# Patient Record
Sex: Male | Born: 1955
Health system: Southern US, Community
[De-identification: ages and names within clinical notes are randomized; demographics above are authoritative.]

## PROBLEM LIST (undated history)

## (undated) DIAGNOSIS — G43909 Migraine, unspecified, not intractable, without status migrainosus: Secondary | ICD-10-CM

## (undated) DIAGNOSIS — G4733 Obstructive sleep apnea (adult) (pediatric): Secondary | ICD-10-CM

## (undated) DIAGNOSIS — R351 Nocturia: Secondary | ICD-10-CM

## (undated) DIAGNOSIS — N529 Male erectile dysfunction, unspecified: Secondary | ICD-10-CM

## (undated) DIAGNOSIS — C61 Malignant neoplasm of prostate: Secondary | ICD-10-CM

## (undated) DIAGNOSIS — T4145XA Adverse effect of unspecified anesthetic, initial encounter: Secondary | ICD-10-CM

## (undated) DIAGNOSIS — T8859XA Other complications of anesthesia, initial encounter: Secondary | ICD-10-CM

## (undated) DIAGNOSIS — Z8719 Personal history of other diseases of the digestive system: Secondary | ICD-10-CM

## (undated) DIAGNOSIS — Z23 Encounter for immunization: Secondary | ICD-10-CM

## (undated) DIAGNOSIS — E785 Hyperlipidemia, unspecified: Secondary | ICD-10-CM

## (undated) HISTORY — DX: Malignant neoplasm of prostate: C61

## (undated) HISTORY — DX: Nocturia: R35.1

## (undated) HISTORY — PX: SP THROMBOECT PRIM MECH ADD - SEP: HXRAD91

## (undated) HISTORY — PX: TOTAL HIP ARTHROPLASTY: SHX124

## (undated) HISTORY — DX: Migraine, unspecified, not intractable, without status migrainosus: G43.909

## (undated) HISTORY — DX: Hyperlipidemia, unspecified: E78.5

## (undated) HISTORY — DX: Obstructive sleep apnea (adult) (pediatric): G47.33

## (undated) HISTORY — PX: APPENDECTOMY: SHX54

## (undated) HISTORY — DX: Encounter for immunization: Z23

## (undated) HISTORY — DX: Personal history of other diseases of the digestive system: Z87.19

## (undated) HISTORY — DX: Male erectile dysfunction, unspecified: N52.9

## (undated) HISTORY — PX: PROSTATE BIOPSY: SHX241

---

## 2000-07-03 ENCOUNTER — Emergency Department (HOSPITAL_COMMUNITY): Admission: EM | Admit: 2000-07-03 | Discharge: 2000-07-03 | Payer: Self-pay | Admitting: Emergency Medicine

## 2000-07-05 ENCOUNTER — Emergency Department (HOSPITAL_COMMUNITY): Admission: EM | Admit: 2000-07-05 | Discharge: 2000-07-05 | Payer: Self-pay | Admitting: Emergency Medicine

## 2000-07-19 ENCOUNTER — Emergency Department (HOSPITAL_COMMUNITY): Admission: EM | Admit: 2000-07-19 | Discharge: 2000-07-19 | Payer: Self-pay | Admitting: Emergency Medicine

## 2003-05-12 ENCOUNTER — Inpatient Hospital Stay (HOSPITAL_COMMUNITY): Admission: EM | Admit: 2003-05-12 | Discharge: 2003-05-13 | Payer: Self-pay | Admitting: Emergency Medicine

## 2003-05-12 ENCOUNTER — Encounter (INDEPENDENT_AMBULATORY_CARE_PROVIDER_SITE_OTHER): Payer: Self-pay

## 2003-07-30 ENCOUNTER — Emergency Department (HOSPITAL_COMMUNITY): Admission: EM | Admit: 2003-07-30 | Discharge: 2003-07-30 | Payer: Self-pay | Admitting: Emergency Medicine

## 2004-03-28 ENCOUNTER — Ambulatory Visit (HOSPITAL_BASED_OUTPATIENT_CLINIC_OR_DEPARTMENT_OTHER): Admission: RE | Admit: 2004-03-28 | Discharge: 2004-03-28 | Payer: Self-pay | Admitting: Internal Medicine

## 2004-04-25 ENCOUNTER — Ambulatory Visit (HOSPITAL_BASED_OUTPATIENT_CLINIC_OR_DEPARTMENT_OTHER): Admission: RE | Admit: 2004-04-25 | Discharge: 2004-04-25 | Payer: Self-pay | Admitting: Internal Medicine

## 2006-10-23 ENCOUNTER — Ambulatory Visit (HOSPITAL_BASED_OUTPATIENT_CLINIC_OR_DEPARTMENT_OTHER): Admission: RE | Admit: 2006-10-23 | Discharge: 2006-10-23 | Payer: Self-pay | Admitting: Otolaryngology

## 2006-10-31 ENCOUNTER — Ambulatory Visit: Payer: Self-pay | Admitting: Internal Medicine

## 2010-10-14 ENCOUNTER — Inpatient Hospital Stay (HOSPITAL_COMMUNITY)
Admission: RE | Admit: 2010-10-14 | Discharge: 2010-10-16 | Payer: Self-pay | Source: Home / Self Care | Attending: Orthopedic Surgery | Admitting: Orthopedic Surgery

## 2011-01-05 LAB — CBC
Hemoglobin: 7.9 g/dL — ABNORMAL LOW (ref 13.0–17.0)
Hemoglobin: 9.3 g/dL — ABNORMAL LOW (ref 13.0–17.0)
MCH: 29.8 pg (ref 26.0–34.0)
MCHC: 32 g/dL (ref 30.0–36.0)
RBC: 2.65 MIL/uL — ABNORMAL LOW (ref 4.22–5.81)
RBC: 3.24 MIL/uL — ABNORMAL LOW (ref 4.22–5.81)

## 2011-01-05 LAB — TYPE AND SCREEN: Unit division: 0

## 2011-01-05 LAB — BASIC METABOLIC PANEL
CO2: 27 mEq/L (ref 19–32)
Calcium: 8 mg/dL — ABNORMAL LOW (ref 8.4–10.5)
Calcium: 8.1 mg/dL — ABNORMAL LOW (ref 8.4–10.5)
Chloride: 109 mEq/L (ref 96–112)
GFR calc Af Amer: >60 mL/min (ref >60)
GFR calc Af Amer: >60 mL/min (ref >60)
GFR calc non Af Amer: 58 mL/min — ABNORMAL LOW (ref >60)
Sodium: 138 mEq/L (ref 135–145)
Sodium: 139 mEq/L (ref 135–145)

## 2011-01-05 LAB — ABO/RH: ABO/RH(D): B POS

## 2011-01-06 LAB — CBC
HCT: 41.9 % (ref 39.0–52.0)
Hemoglobin: 13.7 g/dL (ref 13.0–17.0)
MCH: 29.6 pg (ref 26.0–34.0)
MCV: 90.5 fL (ref 78.0–100.0)
Platelets: 201 10*3/uL (ref 150–400)
RBC: 4.63 MIL/uL (ref 4.22–5.81)

## 2011-01-06 LAB — DIFFERENTIAL
Eosinophils Absolute: 0.2 10*3/uL (ref 0.0–0.7)
Eosinophils Relative: 4 % (ref 0–5)
Lymphs Abs: 1.8 10*3/uL (ref 0.7–4.0)
Monocytes Absolute: 0.4 10*3/uL (ref 0.1–1.0)
Monocytes Relative: 9 % (ref 3–12)

## 2011-01-06 LAB — BASIC METABOLIC PANEL
CO2: 27 mEq/L (ref 19–32)
Chloride: 103 mEq/L (ref 96–112)
Creatinine, Ser: 1.44 mg/dL (ref 0.4–1.5)
GFR calc Af Amer: >60 mL/min (ref >60)
Potassium: 4.6 mEq/L (ref 3.5–5.1)

## 2011-01-06 LAB — URINALYSIS, ROUTINE W REFLEX MICROSCOPIC
Nitrite: NEGATIVE
Specific Gravity, Urine: 1.008 (ref 1.005–1.030)
Urobilinogen, UA: 0.2 mg/dL (ref 0.0–1.0)
pH: 6 (ref 5.0–8.0)

## 2011-01-06 LAB — SURGICAL PCR SCREEN: MRSA, PCR: NEGATIVE

## 2011-03-13 NOTE — H&P (Signed)
NAME:  Vincent Daugherty, Vincent Daugherty                        ACCOUNT NO.:  [id number]   MEDICAL RECORD NO.:  [id number]                   PATIENT TYPE:  EMS   LOCATION:  MAJO                                 FACILITY:  [facility]   PHYSICIAN:  Jimmye Norman III, M.D.               DATE OF BIRTH:  [birth year]   DATE OF ADMISSION:  05/12/2003  DATE OF DISCHARGE:                                HISTORY & PHYSICAL   CHIEF COMPLAINT:  The patient is a 55 year old gentleman with abdominal  pain, localized to the right lower quadrant and likely acute appendicitis.   HISTORY OF PRESENT ILLNESS:  The patient became ill yesterday with abdominal  pain, cramping in the lower abdomen, generalized and non-localized to the  right lower quadrant. The ED physician called me upon examining the patient  and getting his laboratory studies, which indicate likely acute appendicitis  by clinical evaluation.   PAST MEDICAL HISTORY:  Remarkable only for migraine headaches, for which he  takes Imitrex intermittently.   PAST SURGICAL HISTORY:  None.   FAMILY HISTORY:  Mother with hypertension. Father is healthy otherwise. He  has a brother who is diabetic.   REVIEW OF SYSTEMS:  Nothing eaten since yesterday. He has not been able to  hold down any foods. He has had bowel movement 2 days ago. Otherwise, he is  fine.  He has no appetite.   SOCIAL HISTORY:  He is a non-smoker and has occasional beer and wine. He  works as a Production designer, theatre/television/film at [employer].   PHYSICAL EXAMINATION:  VITAL SIGNS: Afebrile at 98. Blood pressure 125/79,  pulse of 60, respirations 20. O2 saturations are 100%.  HEENT: He is normocephalic and atraumatic and anicteric. Mucous membranes  are moist and pink.  GENERAL: He looks healthy but just feels uncomfortable and looks nauseated.  NECK: Supple. No palpable masses. No palpable thyroid masses.  CHEST: Clear to auscultation.  CARDIAC: Regular rhythm and rate with no murmurs, gallops, lifts, or heaves.  ABDOMEN: Quiet. He has tenderness in the right lower quadrant with rebound  and guarding and he has a positive Rovsing sign.  RECTAL: Examination performed by the primary care physician in the office  demonstrated heme-positive stools, no palpable masses.   LABORATORY DATA:  Done at the outside facility show a white count of 16,000.  Hemoglobin of 14. Hematocrit of 42%. The UA is pending.   No CT scan was done.   IMPRESSION:  Very likely acute appendicitis based on clinical examination  and a white count of 16,000 with localized tenderness in the right lower  quadrant associated with decreased appetite, nausea, and vomiting.    PLAN:  Take the patient to the operating room for a laparoscopic  appendectomy to be done as soon as possible. The other possibilities are a  Meckel's diverticulum or acute gastroenteritis, however, most likely is  acute appendicitis. The risks and benefits of the procedure  have been  explained to the patient and his wife, and we will proceed with the  procedure as soon as possible.                                               Kathrin Ruddy, M.D.    JW/MEDQ  D:  05/12/2003  T:  05/13/2003  Job:  [id number]

## 2011-03-13 NOTE — Op Note (Signed)
NAME:  Vincent Daugherty, Vincent Daugherty                        ACCOUNT NO.:  [id number]   MEDICAL RECORD NO.:  [id number]                   PATIENT TYPE:  INP   LOCATION:  1828                                 FACILITY:  [facility]   PHYSICIAN:  Jimmye Norman III, M.D.               DATE OF BIRTH:  1955/11/14   DATE OF PROCEDURE:  05/12/2003  DATE OF DISCHARGE:                                 OPERATIVE REPORT   PREOPERATIVE DIAGNOSIS:  Acute appendicitis.   POSTOPERATIVE DIAGNOSIS:  Acute suppurative appendicitis.   PROCEDURE:  Laparoscopic appendectomy.   SURGEON:  Jimmye Norman, M.D.   ANESTHESIA:  General endotracheal.   ESTIMATED BLOOD LOSS:  Less than 30 mL.   COMPLICATIONS:  None.   CONDITION:  Stable.   FINDINGS:  Acutely inflamed but no overt perforation but a small amount of  purulent fluid in the right paracolic area.  It was densely adherent  laterally but not retrocecal and also adherent to omentum medially.   OPERATION:  The patient was taken to the operating room and placed on the  table in a supine position.  After an adequate endotracheal anesthetic was  administered, he was prepped and draped in the usual sterile manner exposing  the midline and the full abdomen.   The patient was placed in Trendelenburg position.  A supraumbilical  curvilinear incision was made using a #11 blade and taken down to the  midline fascia.  It was through this fascia that a Veress needle was passed  into the peritoneal cavity and confirmed to be in position using the saline  test.  Once this was done carbon dioxide insufflation was instilled into the  peritoneal cavity up to a maximal intra-abdominal pressure of 15 mmHg.  Once  this was done, an 11/12 mm trocar and cannula were passed through the  supraumbilical fascia into the peritoneal cavity and confirmed to be in  position with the laparoscope and attached camera and light source.   With the patient in Trendelenburg and the left side tilted  down, we placed a  right subcostal 5 mm cannula under direct vision into the peritoneal cavity  and using a grasper were able to mobilize the cecum and sort of disclose the  acutely inflamed suppurative appendicitis.   A suprapubic 11/12 mm cannula with a blunt tube was passed into the  peritoneal cavity under direct vision also.  With the appropriate adapter, a  second dissector was placed through the suprapubic cannula and we  subsequently dissected out the appendix.  It was densely adherent laterally  and medially, and the midportion of the appendix was what appeared to be  acutely inflamed.  We were able to find the attachment of the appendix to  the base of the cecum and were able to detach it there using an Endo-GIA  with 3.5 mm staples.  We were then able to dissect away most of the  adhesions to the mesoappendix and then subsequently come across the  mesoappendix using a 2.5 mm stapling Endo-GIA.  Once this was done we were  able to bring the appendix out through the suprapubic site using EndoCatch  bag.   We irrigated with about 2 L of warm saline solution.  There was minimal to  no bleeding at the conclusion of the case.  The appendix was examined for  continuity afterwards, and it was found to be fine.   The supraumbilical fascia was reapproximated using a figure-of-eight stitch  of 0 Vicryl passed on a UR-6 needle.  We injected 0.25% with epinephrine at  all sites.  We then closed the skin using a running subcuticular stitch of 5-  0 Vicryl.  All needle counts, sponge counts, and instrument counts were  correct.  Sterile dressings were applied.                                               Kathrin Ruddy, M.D.    JW/MEDQ  D:  05/12/2003  T:  05/14/2003  Job:  [id number]

## 2011-03-13 NOTE — Procedures (Signed)
NAMEOZZY, BOHLKEN NO.:  [id number]   MEDICAL RECORD NO.:  [id number]          PATIENT TYPE:  OUT   LOCATION:  SLEEP CENTER                 FACILITY:  [facility]   PHYSICIAN:  Clinton D. Maple Hudson, MD, FCCP, FACPDATE OF BIRTH:  05-22-1956   DATE OF STUDY:  10/23/2006                            NOCTURNAL POLYSOMNOGRAM   REFERRING PHYSICIAN:  Onalee Hua L. Annalee Genta, M.D.   INDICATION FOR STUDY:  Hypersomnia with sleep apnea.   EPWORTH SLEEPINESS SCORE:  6/24.  BMI 25.1.  Weight 196 pounds.   MEDICATIONS:  Were listed and reviewed.   SLEEP ARCHITECTURE:  Total sleep time 331 minutes with sleep efficiency  78%.  Stage I was 11%, stage II 71%, stages III and IV absent, REM 18%  of total sleep time.  Sleep latency 32 minutes.  REM latency 92 minutes.  Awake after sleep onset 61 minutes.  Arousal index 9.8.  No bedtime  medication was reported.   RESPIRATORY DATA:  Apnea/hypopnea index (AHI, RDI) 4.9 obstructive  events per hour.  This included 1 central apnea, 24 obstructive apneas,  1 mixed apnea, and 1 hypopnea.  Events were substantially more common  while supine (AHI 6.1 per hour).  REM AHI 9.2.  There were insufficient  events to quality for CPAP titration by split protocol on this study  night.   OXYGEN DATA:  Moderately loud snoring with oxygen desaturation to a  nadir of 92%.  Mean oxygen saturation through the study was 97% on room  air.   CARDIAC DATA:  Normal sinus rhythm.   MOVEMENT-PARASOMNIA:  Rare leg jerk with little effect on sleep.   IMPRESSIONS-RECOMMENDATIONS:  1. Occasional sleep disorder breathing events, AHI 4.9 per hour      (normal range 0-5 per hour).  Events were more common while supine      and in REM.  Moderately loud snoring with oxygen desaturation with      nadir of 92%.  2. There were insufficient events to qualify for CPAP titration by      split protocol on this study night.  A baseline      diagnostic study on March 28, 2004, had  recorded an AHI of 30 per      hour.  CPAP titration on April 25, 2004, had set CPAP pressure at 7      CWP, AHI 0.4 per hour.      Clinton D. Maple Hudson, MD, [facility], FACP  Diplomate, Biomedical engineer of Sleep Medicine  Electronically Signed     CDY/MEDQ  D:  10/31/2006 12:01:35  T:  10/31/2006 21:14:04  Job:  [id number]

## 2013-03-23 ENCOUNTER — Ambulatory Visit (INDEPENDENT_AMBULATORY_CARE_PROVIDER_SITE_OTHER): Payer: BC Managed Care – PPO | Admitting: Licensed Clinical Social Worker

## 2013-03-23 DIAGNOSIS — F4320 Adjustment disorder, unspecified: Secondary | ICD-10-CM

## 2013-03-27 ENCOUNTER — Ambulatory Visit (INDEPENDENT_AMBULATORY_CARE_PROVIDER_SITE_OTHER): Payer: BC Managed Care – PPO | Admitting: Licensed Clinical Social Worker

## 2013-03-27 DIAGNOSIS — F4320 Adjustment disorder, unspecified: Secondary | ICD-10-CM

## 2013-04-03 ENCOUNTER — Ambulatory Visit (INDEPENDENT_AMBULATORY_CARE_PROVIDER_SITE_OTHER): Payer: BC Managed Care – PPO | Admitting: Licensed Clinical Social Worker

## 2013-04-03 DIAGNOSIS — F4320 Adjustment disorder, unspecified: Secondary | ICD-10-CM

## 2013-04-17 ENCOUNTER — Ambulatory Visit (INDEPENDENT_AMBULATORY_CARE_PROVIDER_SITE_OTHER): Payer: BC Managed Care – PPO | Admitting: Licensed Clinical Social Worker

## 2013-04-17 DIAGNOSIS — F4320 Adjustment disorder, unspecified: Secondary | ICD-10-CM

## 2013-05-03 ENCOUNTER — Ambulatory Visit: Payer: BC Managed Care – PPO | Admitting: Licensed Clinical Social Worker

## 2013-08-31 ENCOUNTER — Encounter (INDEPENDENT_AMBULATORY_CARE_PROVIDER_SITE_OTHER): Payer: Self-pay

## 2013-08-31 ENCOUNTER — Ambulatory Visit (INDEPENDENT_AMBULATORY_CARE_PROVIDER_SITE_OTHER): Payer: BC Managed Care – PPO | Admitting: Neurology

## 2013-08-31 ENCOUNTER — Encounter: Payer: Self-pay | Admitting: Neurology

## 2013-08-31 VITALS — BP 123/78 | HR 78 | Temp 97.4°F | Ht 74.5 in | Wt 234.0 lb

## 2013-08-31 DIAGNOSIS — G4733 Obstructive sleep apnea (adult) (pediatric): Secondary | ICD-10-CM

## 2013-08-31 DIAGNOSIS — R351 Nocturia: Secondary | ICD-10-CM

## 2013-08-31 DIAGNOSIS — J309 Allergic rhinitis, unspecified: Secondary | ICD-10-CM

## 2013-08-31 DIAGNOSIS — G43109 Migraine with aura, not intractable, without status migrainosus: Secondary | ICD-10-CM

## 2013-08-31 HISTORY — DX: Obstructive sleep apnea (adult) (pediatric): G47.33

## 2013-08-31 NOTE — Progress Notes (Signed)
Subjective:    Patient ID: Vincent Daugherty is a 57 y.o. male.  HPI  Huston Foley, MD, PhD [facility] [address] [address] [place], [place] [address]  Dear Dr. Clelia Croft,   I saw your patient, Vincent Daugherty, upon your kind request in my neurologic clinic today for initial consultation of his sleep disorder, in particular his obstructive sleep apnea. The patient is accompanied by his wife, Vincent Daugherty, today. As you know, Vincent Daugherty is a very pleasant 57 year old right-handed gentleman with an underlying medical history of allergic rhinitis, diverticulosis, arthritis, status post left total hip replacement surgery in December 2011, hyperlipidemia, migraines, erectile dysfunction, and prior diagnosis of obstructive sleep apnea who has previously been treated with CPAP but has not used his CPAP machine in over 2 years and needs a new machine. I reviewed his sleep study report from 10/23/2006 which you kindly included. His total AHI was 4.9 per hour rising to 6.1 per hour. He had an increased percentage of stage I and stage II sleep, a normal REM latency and mildly decrease of REM percentage, and absence of deep sleep. According to the sleep study report he has previously been diagnosed with severe obstructive sleep apnea based on an AHI of 30 per hour in June 2005 as well as a CPAP titration study in July 2005 during which time his pressure was 7 cm of water pressure. He used it for about 1-2 months, but did not like the noise of the machine and the mask was uncomfortable. He brought in his old machine, but I was not able to see what pressure it was on. He had a [employer]. He had a [employer] nasal pillows, size M.   His typical bedtime is reported to be around 9 to 9:30 PM and usual wake time is around 5 to 5:30 AM. Sleep onset typically occurs within minutes. He reports feeling poorly rested upon awakening. He wakes up on an average 4 times in the middle of the  night and has to go to the bathroom 3 or 4 times on a typical night. He has no Hx of prostrate problems and has been checked for DM. He admits to occasional morning headaches.  He reports excessive daytime somnolence (EDS) and His Epworth Sleepiness Score (ESS) is 9/24 today. He has not fallen asleep while driving. The patient has not been taking a planned nap, but has taken inadvertent naps. He reports dreaming in a nap occasionally. Naps can be refreshing.  He has been known to snore for the past many years. Snoring is reportedly severe, and associated with choking sounds and witnessed apneas. The patient admits to a sense of choking or strangling feeling. There is no report of nighttime reflux, with occasional nighttime cough experienced. The patient has not noted any RLS symptoms but  is known to kick while asleep or before falling asleep. There is family history of RLS or OSA. His mother has narcolepsy and some memory loss. He is a restless sleeper and in the morning, the bed is quite disheveled.   He denies cataplexy, sleep paralysis, hypnagogic or hypnopompic hallucinations, or sleep attacks. He does report vivid dreams, some nightmares, and his wife reports occasional dream enactments, some sleep talking, but no sleep walking. He consumes 1.5 caffeinated beverages per day, usually in the form of coffee in the morning.   His bedroom is usually dark and cool. There is a TV in the bedroom and usually it is  on at night.   His Past Medical History Is Significant For: Past Medical History  Diagnosis Date  . Other and unspecified hyperlipidemia   . Migraine, unspecified, without mention of intractable migraine without mention of status migrainosus   . Obstructive sleep apnea (adult) (pediatric)   . Nocturia   . Impotence of organic origin   . Personal history of other diseases of digestive system   . Need for prophylactic vaccination and inoculation against influenza     His Past Surgical History  Is Significant For: Past Surgical History  Procedure Laterality Date  . Sp thromboect prim mech add - sep      anterior approach    His Family History Is Significant For: Family History  Problem Relation Age of Onset  . Ulcers Father   . Hypertension Mother   . CAD Mother   . Stroke Mother     His Social History Is Significant For: History   Social History  . Marital Status: Married    Spouse Name: N/A    Number of Children: N/A  . Years of Education: N/A   Social History Main Topics  . Smoking status: Former Smoker    Types: Cigarettes  . Smokeless tobacco: None  . Alcohol Use: 0.0 oz/week    0.5 drink(s) per week  . Drug Use: No  . Sexual Activity: None   Other Topics Concern  . None   Social History Narrative  . None    His Allergies Are:  Allergies  Allergen Reactions  . Ampicillin Diarrhea  :   His Current Medications Are:  Outpatient Encounter Prescriptions as of 08/31/2013  Medication Sig  . simvastatin (ZOCOR) 40 MG tablet Take 1 tablet by mouth daily.  . SUMAtriptan (IMITREX) 100 MG tablet Take 1 tablet by mouth as needed.  . sildenafil (VIAGRA) 100 MG tablet Take 100 mg by mouth daily as needed for erectile dysfunction.  :  Review of Systems:  Out of a complete 14 point review of systems, all are reviewed and negative with the exception of these symptoms as listed below:  Review of Systems  Constitutional: Positive for fatigue and unexpected weight change (gain).  HENT: Positive for rhinorrhea and trouble swallowing.   Eyes: Negative.   Respiratory: Positive for shortness of breath.        Snoring  Cardiovascular: Negative.   Gastrointestinal: Negative.   Endocrine: Positive for polydipsia.  Genitourinary: Positive for difficulty urinating.  Musculoskeletal: Negative.   Skin: Positive for rash.  Allergic/Immunologic: Positive for environmental allergies.  Neurological: Positive for headaches.  Hematological: Negative.    Psychiatric/Behavioral: Positive for sleep disturbance.    Objective:  Neurologic Exam  Physical Exam Physical Examination:   Filed Vitals:   08/31/13 0824  BP: 123/78  Pulse: 78  Temp: 97.4 F (36.3 C)    General Examination: The patient is a very pleasant 57 y.o. male in no acute distress. He appears well-developed and well-nourished and very well groomed.   HEENT: Normocephalic, atraumatic, pupils are equal, round and reactive to light and accommodation. Funduscopic exam is normal with sharp disc margins noted. Extraocular tracking is good without limitation to gaze excursion or nystagmus noted. Normal smooth pursuit is noted. Hearing is grossly intact. Tympanic membranes are clear bilaterally. Face is symmetric with normal facial animation and normal facial sensation. Speech is clear with no dysarthria noted. There is no hypophonia. There is no lip, neck/head, jaw or voice tremor. Neck is supple with full range of passive and  active motion. There are no carotid bruits on auscultation. Oropharynx exam reveals: mild mouth dryness, dentures in place. There is a moderately airway crowding, due to large tongue and floppy soft palate; uvula and tonsils are actually small. Mallampati is class II. Tongue protrudes centrally and palate elevates symmetrically. Tonsils are 1+. Neck size is 16.25 inches.   Chest: Clear to auscultation without wheezing, rhonchi or crackles noted.  Heart: S1+S2+0, regular and normal without murmurs, rubs or gallops noted.   Abdomen: Soft, non-tender and non-distended with normal bowel sounds appreciated on auscultation.  Extremities: There is no pitting edema in the distal lower extremities bilaterally. Pedal pulses are intact.  Skin: Warm and dry without trophic changes noted. There are no varicose veins.  Musculoskeletal: exam reveals no obvious joint deformities, tenderness or joint swelling or erythema.   Neurologically:  Mental status: The patient is  awake, alert and oriented in all 4 spheres. His memory, attention, language and knowledge are appropriate. There is no aphasia, agnosia, apraxia or anomia. Speech is clear with normal prosody and enunciation. Thought process is linear. Mood is congruent and affect is normal.  Cranial nerves are as described above under HEENT exam. In addition, shoulder shrug is normal with equal shoulder height noted. Motor exam: Normal bulk, strength and tone is noted. There is no drift, tremor or rebound. Romberg is negative. Reflexes are 2+ throughout. Fine motor skills are intact with normal finger taps, normal hand movements, normal rapid alternating patting, normal foot taps and normal foot agility.  Cerebellar testing shows no dysmetria or intention tremor on finger to nose testing. Heel to shin is unremarkable bilaterally. There is no truncal or gait ataxia.  Sensory exam is intact to light touch, pinprick, vibration, temperature sense and proprioception in the upper and lower extremities.  Gait, station and balance are unremarkable. No veering to one side is noted. No leaning to one side is noted. Posture is age-appropriate and stance is narrow based. No problems turning are noted. He turns en bloc.               Assessment and Plan:   In summary, Vincent Daugherty is a very pleasant 57 y.o.-year old male with a history and physical exam concerning for obstructive sleep apnea (OSA). I had a long chat with the patient and his wife about my findings and the diagnosis, its prognosis and treatment options. We talked about medical treatments and non-pharmacological approaches. I explained in particular the risks and ramifications of untreated moderate to severe OSA, especially with respect to developing cardiovascular disease down the Road, including congestive heart failure, difficult to treat hypertension, cardiac arrhythmias, or stroke. Even type 2 diabetes has in part been linked to untreated OSA. We talked about  trying to maintain a healthy lifestyle in general, as well as the importance of weight control. I encouraged the patient to eat healthy, exercise daily and keep well hydrated, to keep a scheduled bedtime and wake time routine, to not skip any meals and eat healthy snacks in between meals. I also talked to them about improving sleep hygiene. Having the TV on at night his one of the bigger point of contention between them. This may actually improve as she claims that she has to leave the TV on because his snoring is Daugherty loud. I recommended the following at this time: sleep study with potential positive airway pressure titration.  I explained the sleep test procedure to the patient and also outlined possible surgical and non-surgical treatment  options of OSA, including the use of a custom-made dental device, upper airway surgical options, such as pillar implants, radiofrequency surgery, tongue base surgery, and UPPP. I also explained the CPAP treatment option to the patient, who indicated that he would be willing to try CPAP if the need arises. I explained the importance of being compliant with PAP treatment, not only for insurance purposes but primarily to improve His symptoms, and for the patient's long term health benefit, including to reduce His cardiovascular risks. I answered all their questions today and the patient and his wife were in agreement. I would like to see him back after the sleep study is completed and encouraged him to call with any interim questions, concerns, problems or updates.   Thank you very much for allowing me to participate in the care of this nice patient. If I can be of any further assistance to you please do not hesitate to call me at [phone].  Sincerely,   Huston Foley, MD, PhD

## 2013-08-31 NOTE — Patient Instructions (Addendum)

## 2013-09-09 ENCOUNTER — Ambulatory Visit (INDEPENDENT_AMBULATORY_CARE_PROVIDER_SITE_OTHER): Payer: BC Managed Care – PPO | Admitting: Neurology

## 2013-09-09 DIAGNOSIS — IMO0002 Reserved for concepts with insufficient information to code with codable children: Secondary | ICD-10-CM

## 2013-09-09 DIAGNOSIS — J309 Allergic rhinitis, unspecified: Secondary | ICD-10-CM

## 2013-09-09 DIAGNOSIS — G4733 Obstructive sleep apnea (adult) (pediatric): Secondary | ICD-10-CM

## 2013-09-09 DIAGNOSIS — R351 Nocturia: Secondary | ICD-10-CM

## 2013-09-09 DIAGNOSIS — G479 Sleep disorder, unspecified: Secondary | ICD-10-CM

## 2013-09-09 DIAGNOSIS — G43109 Migraine with aura, not intractable, without status migrainosus: Secondary | ICD-10-CM

## 2013-09-13 NOTE — Sleep Study (Signed)
See media tab for full report  

## 2013-09-27 ENCOUNTER — Telehealth: Payer: Self-pay | Admitting: Neurology

## 2013-09-27 ENCOUNTER — Encounter: Payer: Self-pay | Admitting: *Deleted

## 2013-09-27 DIAGNOSIS — G4733 Obstructive sleep apnea (adult) (pediatric): Secondary | ICD-10-CM

## 2013-09-27 NOTE — Telephone Encounter (Signed)
I called and spoke with the patient about his recent sleep study result which confirmed mild sleep apnea especially when sleeping on his back and most pronounced when he was in dream sleep.  I informed the patient that Dr. Frances Furbish recommends he try CPAP therapy at home for 30 days. Patient was in agreement with the plan, so I will send his order to [facility] and they will call him after getting his benefits for the CPAP machine. I informed the patient that I will mail him a copy of the report along with a letter with follow up instruction and I will fax Dr. Alver Fisher office a copy of the report.

## 2013-09-27 NOTE — Telephone Encounter (Signed)
Please advise patient that his recent sleep study confirmed overall mild sleep apnea. It is worse when he sleeps on his back and most pronounced when he is in dream sleep. While this does not warrant treatment with CPAP, I would like for him to consider using CPAP because of his sleep-related issues. One issue was that his sleep was very fragmented and disturbed. He does not go into any consolidated sleep. He also reported to me that he has to get up and use the bathroom frequently at night. He also reported that he occasionally wakes up with the headache and that he typically does not wake up rested. All of this may potentially improve with CPAP.if he agrees we can try him on CPAP at home with a so-called auto PAP machine. This means that I would prescribe a machine that provides variable pressure depending on his degree of obstruction. This could give [place] a good idea about how he does with CPAP. We can try this for about 30 days and I can monitor the computerized report from the machine and see him back in followup in about 5-6 weeks. I then placed a order for the auto PAP. Please let me know, what he wants to do.

## 2014-01-15 ENCOUNTER — Encounter: Payer: Self-pay | Admitting: Neurology

## 2014-01-19 NOTE — Progress Notes (Signed)
Quick Note:  I reviewed the patient's auto CPAP compliance data from 12/06/2013 to 01/04/2014, which is a total of 30 days, during which time the patient used CPAP every day except for 1 day. The average usage for all days was 6 hours and 2 minutes. The percent used days greater than 4 hours was 87 %, indicating very good compliance. The residual AHI was 2.3 per hour, indicating an appropriate treatment pressure range of 6-16 with EPR of 2. 95th percentile of pressure was 9.1 cm. I will review this data with the patient at the next office visit, provide feedback and additional troubleshooting if need be. He is currently scheduled for 01/22/2014 at 8:30 AM. I will most likely suggest that we change him to a set pressure of 9 cm at the time.   Star Age, MD, PhD [facility] ([facility]   ______

## 2014-01-22 ENCOUNTER — Encounter: Payer: Self-pay | Admitting: Neurology

## 2014-01-22 ENCOUNTER — Ambulatory Visit (INDEPENDENT_AMBULATORY_CARE_PROVIDER_SITE_OTHER): Payer: BC Managed Care – PPO | Admitting: Neurology

## 2014-01-22 ENCOUNTER — Encounter (INDEPENDENT_AMBULATORY_CARE_PROVIDER_SITE_OTHER): Payer: Self-pay

## 2014-01-22 VITALS — BP 134/81 | HR 57 | Ht 74.5 in | Wt 234.0 lb

## 2014-01-22 DIAGNOSIS — G4733 Obstructive sleep apnea (adult) (pediatric): Secondary | ICD-10-CM

## 2014-01-22 DIAGNOSIS — Z9989 Dependence on other enabling machines and devices: Principal | ICD-10-CM

## 2014-01-22 DIAGNOSIS — G43109 Migraine with aura, not intractable, without status migrainosus: Secondary | ICD-10-CM

## 2014-01-22 DIAGNOSIS — J309 Allergic rhinitis, unspecified: Secondary | ICD-10-CM

## 2014-01-22 NOTE — Progress Notes (Signed)
Subjective:    Patient ID: Vincent Daugherty is a 58 y.o. male.  HPI    Interim history:   Vincent Daugherty is a very pleasant 58 year old right-handed gentleman with an underlying medical history of allergic rhinitis, diverticulosis, arthritis, status post left total hip replacement surgery in December 2011, hyperlipidemia, migraines, erectile dysfunction, and prior diagnosis of obstructive sleep apnea, who presents for followup consultation of his obstructive sleep apnea, after his recent sleep study. He is unaccompanied today. I first met him on 08/31/2013, at which time he reported occasional morning headaches, and nighttime sleep disruption. I asked him to return for sleep study. He had a baseline sleep study on 09/09/2013 and I went over his test results with him in detail today. His sleep efficiency was reduced at 81%. Latency to sleep was 4 minutes and wake after sleep onset was 78 minutes with severe sleep fragmentation noted. He had an elevated arousal index of 40.7 per hour. His sleep architecture showed an increased percentage of stage I and stage II sleep, a normal percentage of deep sleep and a decreased percentage of REM sleep at 11.4% with a mildly prolonged REM latency. He had no significant periodic leg movements of sleep and no significant EKG changes. Mild to moderate snoring was noted. He had a total AHI of 11.7 per hour, rising to 37.5 per hour in rem sleep and 14.6 per hour in the supine position. Baseline oxygen saturation was 95%, nadir was 86%. Based on the test results I suggested he try AutoPap nonprescribed and AutoPap machine for him. I reviewed compliance data from 12/06/2013 through 01/04/2014 which is a total of 30 days during which time he uses CPAP every night except for one night. Percent used days greater than 4 hours was 87%, indicating very good compliance. Average usage was 6 hours and 2 minutes. Residual AHI was 2.3 per hour. Leak was low. 95th percentile pressure was 9.1  cm. Maximum pressure was 10.3 cm, median pressure was 7.5 cm, range from 6-16 cm with EPR of 2.  Today, reviewed his compliance data from 12/22/2013 to 01/20/2014 which is the last 30 days, during which time he is CPAP every night. Percent used days greater than 4 hours was 93%, indicating excellent compliance. Average usage was 5 hours and 53 minutes, AHI was 2.4 and leak was low at 7.9 L per minute at the 95th percentile. He is on AutoPap and 95th percentile pressure was 8.8 cm. Today, he reports, feeling a little better overall; his wife reports no more snoring, his nocturia is less and he has had less AM HAs. He still has migraines quite a bit. He feels less tired during the day, and is not waking up as much in the middle of the night. He has a much better experience overall as compared to his last CPAP trial.   He had previously been treated with CPAP, but stopped using it. I reviewed his sleep study report from 10/23/2006 last time: His total AHI was 4.9 per hour rising to 6.1 per hour. He had an increased percentage of stage I and stage II sleep, a normal REM latency and mildly decrease of REM percentage, and absence of deep sleep. According to the sleep study report he had been diagnosed with severe obstructive sleep apnea before, based on an AHI of 30 per hour in June 2005 as well as a CPAP titration study in July 2005 during which time his pressure was 7 cm of water pressure. He used it  for about 1-2 months, but did not like the noise of the machine and the mask was uncomfortable.    His Past Medical History Is Significant For: Past Medical History  Diagnosis Date  . Other and unspecified hyperlipidemia   . Migraine, unspecified, without mention of intractable migraine without mention of status migrainosus   . Obstructive sleep apnea (adult) (pediatric)   . Nocturia   . Impotence of organic origin   . Personal history of other diseases of digestive system   . Need for prophylactic  vaccination and inoculation against influenza   . OSA (obstructive sleep apnea) 08/31/2013    His Past Surgical History Is Significant For: Past Surgical History  Procedure Laterality Date  . Sp thromboect prim mech add - sep      anterior approach    His Family History Is Significant For: Family History  Problem Relation Age of Onset  . Ulcers Father   . Hypertension Mother   . CAD Mother   . Stroke Mother     His Social History Is Significant For: History   Social History  . Marital Status: Married    Spouse Name: phillis    Number of Children: 3  . Years of Education: college   Occupational History  . DIRECTOR    Social History Main Topics  . Smoking status: Former Smoker    Types: Cigarettes  . Smokeless tobacco: None  . Alcohol Use: 0.0 oz/week    0.5 drink(s) per week  . Drug Use: No  . Sexual Activity: None   Other Topics Concern  . None   Social History Narrative  . None    His Allergies Are:  Allergies  Allergen Reactions  . Ampicillin Diarrhea  :   His Current Medications Are:  Outpatient Encounter Prescriptions as of 01/22/2014  Medication Sig  . sildenafil (VIAGRA) 100 MG tablet Take 100 mg by mouth daily as needed for erectile dysfunction.  . simvastatin (ZOCOR) 40 MG tablet Take 1 tablet by mouth daily.  . SUMAtriptan (IMITREX) 100 MG tablet Take 1 tablet by mouth as needed.  :  Review of Systems:  Out of a complete 14 point review of systems, all are reviewed and negative with the exception of these symptoms as listed below:  Review of Systems  Respiratory:       Snoring, Acting out Dreams  Neurological: Positive for headaches.  Psychiatric/Behavioral: Positive for sleep disturbance.    Objective:  Neurologic Exam  Physical Exam Physical Examination:   Filed Vitals:   01/22/14 0844  BP: 134/81  Pulse: 57   General Examination: The patient is a very pleasant 58 y.o. male in no acute distress. He appears well-developed and  well-nourished and very well groomed.   HEENT: Normocephalic, atraumatic, pupils are equal, round and reactive to light and accommodation. Funduscopic exam is normal with sharp disc margins noted. He may have the beginning of mild cataracts bilaterally Extraocular tracking is good without limitation to gaze excursion or nystagmus noted. Normal smooth pursuit is noted. Hearing is grossly intact. Tympanic membranes are clear bilaterally. Face is symmetric with normal facial animation and normal facial sensation. Speech is clear with no dysarthria noted. There is no hypophonia. There is no lip, neck/head, jaw or voice tremor. Neck is supple with full range of passive and active motion. There are no carotid bruits on auscultation. Oropharynx exam reveals: mild mouth dryness, dentures in place. There is a moderately airway crowding, due to large tongue and floppy soft  palate; uvula and tonsils are actually small. Mallampati is class II. Tongue protrudes centrally and palate elevates symmetrically. Tonsils are 1+. Nasal inspection reveals no significant nasal mucosal bogginess or redness and no septal deviation.   Chest: Clear to auscultation without wheezing, rhonchi or crackles noted.  Heart: S1+S2+0, regular and normal without murmurs, rubs or gallops noted.   Abdomen: Soft, non-tender and non-distended with normal bowel sounds appreciated on auscultation.  Extremities: There is no pitting edema in the distal lower extremities bilaterally. Pedal pulses are intact.  Skin: Warm and dry without trophic changes noted. There are no varicose veins.  Musculoskeletal: exam reveals no obvious joint deformities, tenderness or joint swelling or erythema.   Neurologically:  Mental status: The patient is awake, alert and oriented in all 4 spheres. His memory, attention, language and knowledge are appropriate. There is no aphasia, agnosia, apraxia or anomia. Speech is clear with normal prosody and enunciation.  Thought process is linear. Mood is congruent and affect is normal.  Cranial nerves are as described above under HEENT exam. In addition, shoulder shrug is normal with equal shoulder height noted. Motor exam: Normal bulk, strength and tone is noted. There is no drift, tremor or rebound. Romberg is negative. Reflexes are 2+ throughout. Fine motor skills are intact with normal finger taps, normal hand movements, normal rapid alternating patting, normal foot taps and normal foot agility.  Cerebellar testing shows no dysmetria or intention tremor on finger to nose testing. There is no truncal or gait ataxia.  Sensory exam is intact to light touch, pinprick, vibration, and temperature sense in the upper and lower extremities.  Gait, station and balance are unremarkable. No veering to one side is noted. No leaning to one side is noted. Posture is age-appropriate and stance is narrow based. No problems turning are noted. He turns en bloc.                       Assessment and Plan:   In summary, MEKAI WILKINSON is a very pleasant 58 y.o.-year old male with an underlying medical history of allergic rhinitis, diverticulosis, arthritis, status post left total hip replacement surgery in December 2011, hyperlipidemia, migraines, erectile dysfunction, and prior diagnosis of obstructive sleep apnea, recently had a baseline sleep study and is now established on AutoPap. He has done really well with adapting to treatment this time around. His exam is stable. He is pleased with how he's doing and endorses improvement in his nocturia, his sleep disruption, and daytime tiredness. He no longer snores according to his wife's report. I went over his sleep test results with him in detail and also explaining his compliance data. I congratulated him on his great compliance and encouraged him to continue using CPAP regularly to help reduce his cardiovascular risk long term. He feels good enough that he does not mind continuing with  CPAP treatment. I am really pleased with how he has done. I would like to change him from AutoPap to a set pressure of 9 cm. I will send an order today. He was in agreement. I would like to see him back in about 3 months for recheck since we are changing his pressure settings. In the interim, if he has any questions, concerns, problems are up-to-date he is encouraged to call and he is also encouraged to sign up for my chart. He may have very mild cataracts. He is encouraged to see an eye doctor. He has not been seen in the  last 2 years he states.

## 2014-01-22 NOTE — Patient Instructions (Signed)
Please continue using your CPAP regularly. While your insurance requires that you use CPAP at least 4 hours each night on 70% of the nights, I recommend, that you not skip any nights and use it throughout the night if you can. Getting used to CPAP does take time and patience and discipline. Untreated obstructive sleep apnea when it is moderate to severe can have an adverse impact on cardiovascular health and raise her risk for heart disease, arrhythmias, hypertension, congestive heart failure, stroke and diabetes. Untreated obstructive sleep apnea causes sleep disruption, nonrestorative sleep, and sleep deprivation. This can have an impact on your day to day functioning and cause daytime sleepiness and impairment of cognitive function, memory loss, mood disturbance, and problems focussing. Using CPAP regularly can improve these symptoms.  We will change you from auto PAP to a set pressure of 9 cm. Follow up in 3 months.

## 2014-01-23 ENCOUNTER — Encounter: Payer: Self-pay | Admitting: Neurology

## 2014-03-15 ENCOUNTER — Encounter: Payer: Self-pay | Admitting: Neurology

## 2014-03-23 ENCOUNTER — Encounter: Payer: Self-pay | Admitting: Neurology

## 2014-03-23 NOTE — Progress Notes (Signed)
Quick Note:  I reviewed the patient's CPAP compliance data from 01/06/2014 to 02/04/2014, which is a total of 30 days, during which time the patient used CPAP every day. The average usage for all days was 6 hours and 19 minutes. The percent used days greater than 4 hours was 97 %, indicating excellent compliance. The residual AHI was 2.3 per hour, indicating an adequate treatment pressure of 9 cwp with EPR of 2. Air leak from the mask was low at 9.1 L per minute at the 95th percentile. I will review this data with the patient at the next office visit, which is currently routinely scheduled for 06/19/2014 at 3:30 PM, provide feedback and additional troubleshooting if need be.  Star Age, MD, PhD [facility] ([facility]   ______

## 2014-06-19 ENCOUNTER — Encounter: Payer: Self-pay | Admitting: Neurology

## 2014-06-19 ENCOUNTER — Ambulatory Visit (INDEPENDENT_AMBULATORY_CARE_PROVIDER_SITE_OTHER): Payer: BC Managed Care – PPO | Admitting: Neurology

## 2014-06-19 VITALS — BP 126/75 | HR 62 | Ht 74.75 in | Wt 235.0 lb

## 2014-06-19 DIAGNOSIS — Z9989 Dependence on other enabling machines and devices: Principal | ICD-10-CM

## 2014-06-19 DIAGNOSIS — R351 Nocturia: Secondary | ICD-10-CM

## 2014-06-19 DIAGNOSIS — G43109 Migraine with aura, not intractable, without status migrainosus: Secondary | ICD-10-CM

## 2014-06-19 DIAGNOSIS — J3089 Other allergic rhinitis: Secondary | ICD-10-CM

## 2014-06-19 DIAGNOSIS — G4733 Obstructive sleep apnea (adult) (pediatric): Secondary | ICD-10-CM

## 2014-06-19 NOTE — Patient Instructions (Signed)
Please continue using your CPAP regularly. While your insurance requires that you use CPAP at least 4 hours each night on 70% of the nights, I recommend, that you not skip any nights and use it throughout the night if you can. Getting used to CPAP and staying with the treatment long term does take time and patience and discipline. Untreated obstructive sleep apnea when it is moderate to severe can have an adverse impact on cardiovascular health and raise her risk for heart disease, arrhythmias, hypertension, congestive heart failure, stroke and diabetes. Untreated obstructive sleep apnea causes sleep disruption, nonrestorative sleep, and sleep deprivation. This can have an impact on your day to day functioning and cause daytime sleepiness and impairment of cognitive function, memory loss, mood disturbance, and problems focussing. Using CPAP regularly can improve these symptoms. Follow up in one year.

## 2014-06-19 NOTE — Progress Notes (Signed)
Subjective:    Patient ID: Vincent Daugherty is a 58 y.o. male.  HPI    Interim history:   Vincent Daugherty is a very pleasant 58 year old right-handed gentleman with an underlying medical history of allergic rhinitis, diverticulosis, arthritis, status post left total hip replacement surgery in December 2011, hyperlipidemia, migraines, erectile dysfunction, and prior diagnosis of obstructive sleep apnea, who presents for followup consultation of his obstructive sleep apnea. He is accompanied by his wife today. I last saw him on 01/22/2014, at which time he reported feeling a little better overall. He reported that his wife noted no more snoring, his nocturia was improved and he reported less morning headaches. He felt less tired during the day.he felt his sleep was more consolidated. Overall, he felt his CPAP experience was much better than in the past. Based on his compliance data I suggested we change him from AutoPap to set pressure of 9 cm at the time of his last visit.  I reviewed the patient's CPAP compliance data from 01/06/2014 to 02/04/2014, which is a total of 30 days, during which time the patient used CPAP every day. The average usage for all days was 6 hours and 19 minutes. The percent used days greater than 4 hours was 97 %, indicating excellent compliance. The residual AHI was 2.3 per hour, indicating an adequate treatment pressure of 9 cwp with EPR of 2. Air leak from the mask was low at 9.1 L per minute at the 95th percentile.  Today, I reviewed his compliance data from 05/21/2014 through 06/18/2014 which is a total of 29 days during which time he used his machine every night. Percent used days greater than 4 hours was above 85%, residual AHI low at 2.7, leak low at 11.2, pressure at 9 with EPR of 2, average usage of over 5 hours and 20 minutes.  Today, he reports that nocturia is better. His wife reports, that he sometimes falls asleep before putting the mask on. He indicates that this is  very occasionally the case. He has trouble going back to sleep sometimes. His wife wakes up early at 4 AM to go to the gym and sometimes he wakes up because of this and has trouble going back to sleep. Overall however he indicates that he is doing well and he essentially feels he cannot sleep without the CPAP machine. She indicates that his sleep is much better and his snoring is less but he does have some residual snoring occasionally. He got a new supply is about a month ago and that made a difference with respect to air leaking.   I first met him on 08/31/2013, at which time he reported occasional morning headaches, and nighttime sleep disruption. I asked him to return for sleep study. He had a baseline sleep study on 09/09/2013 and I went over his test results with him in detail today. His sleep efficiency was reduced at 81%. Latency to sleep was 4 minutes and wake after sleep onset was 78 minutes with severe sleep fragmentation noted. He had an elevated arousal index of 40.7 per hour. His sleep architecture showed an increased percentage of stage I and stage II sleep, a normal percentage of deep sleep and a decreased percentage of REM sleep at 11.4% with a mildly prolonged REM latency. He had no significant periodic leg movements of sleep and no significant EKG changes. Mild to moderate snoring was noted. He had a total AHI of 11.7 per hour, rising to 37.5 per hour in REM  sleep and 14.6 per hour in the supine position. Baseline oxygen saturation was 95%, nadir was 86%. Based on the test results I suggested he try AutoPap nonprescribed and AutoPap machine for him. I reviewed compliance data from 12/06/2013 through 01/04/2014 which is a total of 30 days during which time he uses CPAP every night except for one night. Percent used days greater than 4 hours was 87%, indicating very good compliance. Average usage was 6 hours and 2 minutes. Residual AHI was 2.3 per hour. Leak was low. 95th percentile pressure was  9.1 cm. Maximum pressure was 10.3 cm, median pressure was 7.5 cm, range from 6-16 cm with EPR of 2.  I reviewed his compliance data from 12/22/2013 to 01/20/2014 which is 30 days, during which time he used CPAP every night. Percent used days greater than 4 hours was 93%, indicating excellent compliance. Average usage was 5 hours and 53 minutes, AHI was 2.4 and leak was low at 7.9 L per minute at the 95th percentile. He was on AutoPap and 95th percentile pressure was 8.8 cm.  He had previously been treated with CPAP, but stopped using it. I reviewed his sleep study report from 10/23/2006 last time: His total AHI was 4.9 per hour rising to 6.1 per hour. He had an increased percentage of stage I and stage II sleep, a normal REM latency and mildly decrease of REM percentage, and absence of deep sleep. According to the sleep study report he had been diagnosed with severe obstructive sleep apnea before, based on an AHI of 30 per hour in June 2005 as well as a CPAP titration study in July 2005 during which time his pressure was 7 cm of water pressure. He used it for about 1-2 months, but did not like the noise of the machine and the mask was uncomfortable.   His Past Medical History Is Significant For: Past Medical History  Diagnosis Date  . Other and unspecified hyperlipidemia   . Migraine, unspecified, without mention of intractable migraine without mention of status migrainosus   . Obstructive sleep apnea (adult) (pediatric)   . Nocturia   . Impotence of organic origin   . Personal history of other diseases of digestive system   . Need for prophylactic vaccination and inoculation against influenza   . OSA (obstructive sleep apnea) 08/31/2013    His Past Surgical History Is Significant For: Past Surgical History  Procedure Laterality Date  . Sp thromboect prim mech add - sep      anterior approach    His Family History Is Significant For: Family History  Problem Relation Age of Onset  . Ulcers  Father   . Hypertension Mother   . CAD Mother   . Stroke Mother     His Social History Is Significant For: History   Social History  . Marital Status: Married    Spouse Name: phillis    Number of Children: 3  . Years of Education: college   Occupational History  . DIRECTOR    Social History Main Topics  . Smoking status: Former Smoker    Types: Cigarettes  . Smokeless tobacco: None  . Alcohol Use: 0.0 oz/week    0.5 drink(s) per week  . Drug Use: No  . Sexual Activity: None   Other Topics Concern  . None   Social History Narrative  . None    His Allergies Are:  Allergies  Allergen Reactions  . Ampicillin Diarrhea  :   His Current Medications Are:  Outpatient Encounter Prescriptions as of 06/19/2014  Medication Sig  . CIALIS 5 MG tablet   . imipramine (TOFRANIL) 10 MG tablet   . sildenafil (VIAGRA) 100 MG tablet Take 100 mg by mouth daily as needed for erectile dysfunction.  . simvastatin (ZOCOR) 40 MG tablet Take 1 tablet by mouth daily.  . SUMAtriptan (IMITREX) 100 MG tablet Take 1 tablet by mouth as needed.  :  Review of Systems:  Out of a complete 14 point review of systems, all are reviewed and negative with the exception of these symptoms as listed below:   Review of Systems  Musculoskeletal: Positive for neck stiffness.  Skin: Positive for rash.  Neurological: Positive for headaches.  Psychiatric/Behavioral:       Apnea    Objective:  Neurologic Exam  Physical Exam Physical Examination:   Filed Vitals:   06/19/14 1541  BP: 126/75  Pulse: 62    General Examination: The patient is a very pleasant 58 y.o. male in no acute distress. He appears well-developed and well-nourished and very well groomed.   HEENT: Normocephalic, atraumatic, pupils are equal, round and reactive to light and accommodation. Funduscopic exam is normal with sharp disc margins noted. He may have the beginning of mild cataracts bilaterally Extraocular tracking is good  without limitation to gaze excursion or nystagmus noted. Normal smooth pursuit is noted. Hearing is grossly intact. Face is symmetric with normal facial animation and normal facial sensation. Speech is clear with no dysarthria noted. There is no hypophonia. There is no lip, neck/head, jaw or voice tremor. Neck is supple with full range of passive and active motion. There are no carotid bruits on auscultation. Oropharynx exam reveals: mild mouth dryness, dentures in place. There is a moderately airway crowding, due to large tongue and floppy soft palate; uvula and tonsils are actually small. Mallampati is class II. Tongue protrudes centrally and palate elevates symmetrically. Tonsils are 1+. Nasal inspection reveals no significant nasal mucosal bogginess or redness and no septal deviation.   Chest: Clear to auscultation without wheezing, rhonchi or crackles noted.  Heart: S1+S2+0, regular and normal without murmurs, rubs or gallops noted.   Abdomen: Soft, non-tender and non-distended with normal bowel sounds appreciated on auscultation.  Extremities: There is no pitting edema in the distal lower extremities bilaterally. Pedal pulses are intact.  Skin: Warm and dry without trophic changes noted. There are no varicose veins.  Musculoskeletal: exam reveals no obvious joint deformities, tenderness or joint swelling or erythema.   Neurologically:  Mental status: The patient is awake, alert and oriented in all 4 spheres. His memory, attention, language and knowledge are appropriate. There is no aphasia, agnosia, apraxia or anomia. Speech is clear with normal prosody and enunciation. Thought process is linear. Mood is congruent and affect is normal.  Cranial nerves are as described above under HEENT exam. In addition, shoulder shrug is normal with equal shoulder height noted. Motor exam: Normal bulk, strength and tone is noted. There is no drift, tremor or rebound. Romberg is negative. Reflexes are 2+  throughout. Fine motor skills are intact with normal finger taps, normal hand movements, normal rapid alternating patting, normal foot taps and normal foot agility.  Cerebellar testing shows no dysmetria or intention tremor on finger to nose testing. There is no truncal or gait ataxia.  Sensory exam is intact to light touch, pinprick, vibration, and temperature sense in the upper and lower extremities.  Gait, station and balance are unremarkable. No veering to one side is noted.  No leaning to one side is noted. Posture is age-appropriate and stance is narrow based. No problems turning are noted. He turns en bloc. Tandem walk is unremarkable.                Assessment and Plan:   In summary, GEOVANNI RAHMING is a very pleasant 58 year old male with an underlying medical history of allergic rhinitis, diverticulosis, arthritis, status post left total hip replacement surgery in December 2011, hyperlipidemia, migraines, erectile dysfunction, and prior diagnosis of obstructive sleep apnea, who had a baseline sleep study in November of last year which showed mild to moderate obstructive sleep apnea. Given his symptoms I suggested an auto Pap trial. He had done well with that and adjusted to positive airway pressure treatment well. Last time I adjusted his pressure to a set pressure of 9. He has done really well with this. I can gradually get him on his excellent compliance. He is stable in his exam. He indicates good results with CPAP treatment. He is pleased with how he's doing and endorses improvement in his nocturia, his sleep disruption, and daytime tiredness. I again went over his sleep test results with them and also explained to them his compliance data. I encouraged him to continue using CPAP regularly to help reduce his cardiovascular risk long term. He feels good enough that he does not mind continuing with CPAP treatment. I am really pleased with how he has done. I would like to  see him back on a yearly  basis from here. He was in agreement. I answered all their questions today.

## 2014-10-23 ENCOUNTER — Telehealth: Payer: Self-pay | Admitting: Medical Oncology

## 2014-10-23 NOTE — Telephone Encounter (Signed)
I called Mr. Vincent Daugherty to introduce myself as the Prostate Nurse Navigator and the Coordinator of the Prostate [place].  1. I confirmed with the patient he is aware of his referral to the clinic and his appointment November 02, 2014 at 8:15am.  2. I discussed the format of the clinic and the physicians he will be seeing that day.  3. I discussed where the clinic is located and how to contact me.  4. I confirmed his address and informed him I would be mailing a packet of information and forms to be completed. I asked him to bring them with him the day of his appointment.   He voiced understanding of the above. I asked him to call me if he has any questions or concerns regarding his appointments or the forms he needs to complete.  [name], RN, BSN, [place]  [phone]  Fax [phone]

## 2014-10-23 NOTE — Telephone Encounter (Signed)
I called Mr. Hingle and left him a message requesting a call back to discuss his appointment for the Prostate [facility].

## 2014-10-31 ENCOUNTER — Encounter: Payer: Self-pay | Admitting: Radiation Oncology

## 2014-10-31 NOTE — Progress Notes (Signed)
GU Location of Tumor / Histology: prostatic adenocarcinoma  If Prostate Cancer, Gleason Score is (3 + 3) and PSA is (6.2)  Biopsies of prostate (if applicable) revealed:    Past/Anticipated interventions by urology, if any: prostate biopsy  Past/Anticipated interventions by medical oncology, if any: referral to multidisciplinary prostate clinic  Weight changes, if any: no  Bowel/Bladder complaints, if any:  hx of BPH and LUTS, IPSS 20, mild erectile dysfunction  Nausea/Vomiting, if any: no  Pain issues, if any:  no  SAFETY ISSUES:  Prior radiation? no  Pacemaker/ICD? no  Possible current pregnancy? no  Is the patient on methotrexate? no  Current Complaints / other details:  59 year old male. Prostate volume 25.7 cc. Married with three sons.

## 2014-11-01 ENCOUNTER — Telehealth: Payer: Self-pay | Admitting: Medical Oncology

## 2014-11-01 NOTE — Telephone Encounter (Signed)
I called Mr. Pinzon and left him a message with tomorrow's appointment times for the Prostate [facility] clinic. I asked him to call me back if he has any questions or concerns. I look forward to meeting Mr. Weisgerber in the morning.

## 2014-11-02 ENCOUNTER — Ambulatory Visit (HOSPITAL_BASED_OUTPATIENT_CLINIC_OR_DEPARTMENT_OTHER): Payer: BLUE CROSS/BLUE SHIELD | Admitting: Oncology

## 2014-11-02 ENCOUNTER — Encounter: Payer: Self-pay | Admitting: Medical Oncology

## 2014-11-02 ENCOUNTER — Ambulatory Visit
Admission: RE | Admit: 2014-11-02 | Discharge: 2014-11-02 | Disposition: A | Discharge status: Home / Self Care | Payer: BLUE CROSS/BLUE SHIELD | Source: Ambulatory Visit | Attending: Radiation Oncology | Admitting: Radiation Oncology

## 2014-11-02 ENCOUNTER — Encounter: Payer: Self-pay | Admitting: Radiation Oncology

## 2014-11-02 VITALS — BP 133/78 | HR 62 | Temp 98.0°F | Resp 16 | Ht 74.0 in | Wt 235.4 lb

## 2014-11-02 DIAGNOSIS — N400 Enlarged prostate without lower urinary tract symptoms: Secondary | ICD-10-CM | POA: Insufficient documentation

## 2014-11-02 DIAGNOSIS — Z9989 Dependence on other enabling machines and devices: Secondary | ICD-10-CM | POA: Diagnosis not present

## 2014-11-02 DIAGNOSIS — E785 Hyperlipidemia, unspecified: Secondary | ICD-10-CM | POA: Insufficient documentation

## 2014-11-02 DIAGNOSIS — Z96649 Presence of unspecified artificial hip joint: Secondary | ICD-10-CM | POA: Insufficient documentation

## 2014-11-02 DIAGNOSIS — G4733 Obstructive sleep apnea (adult) (pediatric): Secondary | ICD-10-CM | POA: Diagnosis not present

## 2014-11-02 DIAGNOSIS — Z87891 Personal history of nicotine dependence: Secondary | ICD-10-CM | POA: Insufficient documentation

## 2014-11-02 DIAGNOSIS — C61 Malignant neoplasm of prostate: Secondary | ICD-10-CM

## 2014-11-02 DIAGNOSIS — R3912 Poor urinary stream: Secondary | ICD-10-CM

## 2014-11-02 DIAGNOSIS — Z791 Long term (current) use of non-steroidal anti-inflammatories (NSAID): Secondary | ICD-10-CM | POA: Insufficient documentation

## 2014-11-02 DIAGNOSIS — R351 Nocturia: Secondary | ICD-10-CM

## 2014-11-02 DIAGNOSIS — N529 Male erectile dysfunction, unspecified: Secondary | ICD-10-CM

## 2014-11-02 DIAGNOSIS — G43909 Migraine, unspecified, not intractable, without status migrainosus: Secondary | ICD-10-CM | POA: Insufficient documentation

## 2014-11-02 NOTE — Progress Notes (Unsigned)
                               Care Plan Summary  Name: Vincent Daugherty DOB: Mar 12, 1956   Your Medical Team:   Urologist -  Dr. Raynelle Bring, [facility]  Radiation Oncologist - Dr. Tyler Pita, [facility]   Medical Oncologist - Dr. Zola Button, [place]  Recommendations: 1) Active Surveillance  2) Radiation  3) Surgery  * These recommendations are based on information available as of today's consult.      Recommendations may change depending on the results of further tests or exams.    Next Steps: 1) Contact Dr. Lynne Logan office when you are ready to discuss treatment.   When appointments need to be scheduled, you will be contacted by [facility] and/or [facility].  Questions?  Please do not hesitate to call Vincent Rue, RN, BSN, CRNI at [phone] any questions or concerns.  Vincent Daugherty is your Oncology Nurse Navigator and is available to assist you while you're receiving your medical care at [facility].

## 2014-11-02 NOTE — Consult Note (Signed)
Reason for Referral: Prostate cancer.   HPI: 59 year old gentleman native of [place] but currently lives in [place] for at least over 20 years. He is a rather healthy gentleman with obstructive sleep apnea and migraine headaches who was found to have an elevated PSA up to 6.2 on a screen for prostate cancer. He subsequently was referred to Dr. Alinda Money and under a biopsy on 10/02/2014. The biopsy showed prostate cancer of Gleason score 3+3 = 6 in at least 2 areas 1 in the right midlobe and another one in the left base. There are other few areas of questionable Gleason score 6 and possible atypical glands. He does not really have any specific symptoms he does have mild erectile dysfunction and does have some lower urinary tract symptoms include nocturia and weak stream. His IPSS score is 20. Otherwise completely asymptomatic. He is a rather healthy gentleman without any complaints. Clinically he does not report any headaches or blurry vision or syncope. Does not report any fevers or chills or sweats. Does not report any chest pain, shortness of breath or difficulty breathing. Is not reporting nausea, vomiting, abdominal pain or change in his bowel habits. He does not report any hematuria or dysuria. Rest of his review of systems unremarkable.   Past Medical History  Diagnosis Date  . Other and unspecified hyperlipidemia   . Migraine, unspecified, without mention of intractable migraine without mention of status migrainosus   . Obstructive sleep apnea (adult) (pediatric)   . Nocturia   . Impotence of organic origin   . Personal history of other diseases of digestive system   . Need for prophylactic vaccination and inoculation against influenza   . OSA (obstructive sleep apnea) 08/31/2013  . Prostate cancer   :  Past Surgical History  Procedure Laterality Date  . Sp thromboect prim mech add - sep      anterior approach  . Prostate biopsy    . Appendectomy    . Total hip arthroplasty     :   Current Outpatient Prescriptions  Medication Sig Dispense Refill  . CIALIS 5 MG tablet     . imipramine (TOFRANIL) 10 MG tablet     . sildenafil (VIAGRA) 100 MG tablet Take 100 mg by mouth daily as needed for erectile dysfunction.    . simvastatin (ZOCOR) 40 MG tablet Take 1 tablet by mouth daily.    . SUMAtriptan (IMITREX) 100 MG tablet Take 1 tablet by mouth as needed.     No current facility-administered medications for this visit.     Allergies  Allergen Reactions  . Ampicillin Diarrhea  :  Family History  Problem Relation Age of Onset  . Ulcers Father   . Hypertension Mother   . CAD Mother   . Stroke Mother   :  History   Social History  . Marital Status: Married    Spouse Name: phillis    Number of Children: 3  . Years of Education: college   Occupational History  . DIRECTOR    Social History Main Topics  . Smoking status: Former Smoker    Types: Cigarettes  . Smokeless tobacco: Never Used  . Alcohol Use: 0.0 oz/week    0.5 Not specified per week  . Drug Use: No  . Sexual Activity: Not on file   Other Topics Concern  . Not on file   Social History Narrative  :  Pertinent items are noted in HPI.  Exam: ECOG 0 There were no vitals taken for  this visit. General appearance: alert and cooperative Head: Normocephalic, without obvious abnormality Throat: lips, mucosa, and tongue normal; teeth and gums normal Neck: no adenopathy Back: negative Resp: clear to auscultation bilaterally Cardio: regular rate and rhythm, S1, S2 normal, no murmur, click, rub or gallop GI: soft, non-tender; bowel sounds normal; no masses,  no organomegaly Extremities: extremities normal, atraumatic, no cyanosis or edema Pulses: 2+ and symmetric Skin: Skin color, texture, turgor normal. No rashes or lesions    Assessment and Plan:   59 year old gentleman with diagnosis of prostate cancer with a Gleason score 3+3 = 6 PSA of 6.2 with clinical staging of T1c. Case was  discussed extensively today in the prostate cancer multidisciplinary clinic. His pathology was discussed with the reviewing pathologist. These findings were discussed with the patient and his wife extensively today. The natural course of this disease was also discussed. It was felt given his low-grade tumor that he has multiple options of treatment. Observation and surveillance is an option which include likely a baseline MRI, frequent PSA checks and repeated biopsies. However, given his young age and his fear of developing metastatic disease definitive treatments might also be a reasonable option for him. Options would include radical prostatectomy versus radiation therapy were discussed. The advantages of radical prostatectomy would provide definitive pathological confirmation of his cancer and better prognostication. Complications associated with surgery were discussed briefly and will be discussed further by Dr. Alinda Money.  From a medical oncology standpoint, I see certainly no need for systemic therapy. However should he develop systemic disease and explained to him the role of systemic therapy in the form of hormone therapy and possible chemotherapy. He is leaning towards having definitive therapy and I feel he will be an excellent candidate for radical prostatectomy at this point.

## 2014-11-02 NOTE — Progress Notes (Signed)
Preferred name: Maryann Conners. PCP: Dr. Sable Feil. Urologist: Dr. Alinda Money. Pharmacy: [facility]. Denies having a pacemaker. Denies hx of xrt. Allergic to ampicillin. President of [employer]. Married to wife, Hakeen Shipes. Three sons, Jyl Heinz, and Rodman Key. Last colonoscopy in 210. Reports that he performs regular self testicular exams. Reports weight changes. Reports loss of sleep. Reports fatigue. Wears glasses. Reports sinus problems. Wears dentures. Reports joint pain, arthritis, headaches, and forgetfulness.

## 2014-11-02 NOTE — Consult Note (Signed)
Chief Complaint  Prostate Cancer   Reason For Visit  Reason for visit: To discuss treatment options for prostate cancer. Location of visit: [place]   History of Present Illness  Vincent Daugherty is a 59 year old gentleman who was noted to have an elevated PSA of 6.2.  He underwent a prostate needle biopsy on 10/02/14 that demonstrated Gleason 3+3=6 adenocarcinoma of the prostate in 4 out of 12 biopsy cores. He has no family history of prostate cancer.  Overall, he is very healthy.  His only comorbidities include obstructive sleep apnea and dyslipidemia.  TNM stage: cT1c Nx Mx PSA: 6.2 Gleason score: 3+3=6 Biopsy (10/02/14): 4/12 cores positive    Left: L mid (10%), L base (15%)    Right: R mid (20%), R lateral mid (5%) Prostate volume: 25.7 cc PSAD: 0.24  Nomogram OC disease: 56% EPE: 44% SVI: 1% LNI: 1% PFS (surgery): 93% at 5 years, 88% at 10 years  Urinary function: He has a history of BPH and LUTS but has not required treatment. IPSS is 20. Erectile function: He has mild erectile dysfunction that has not required treatment. SHIM score is 19.  Interval history:  He follows up today with with his wife to further discuss his treatment/management options for prostate cancer.  He has previously seen Dr. Alen Blew and Dr. Tammi Klippel this morning.  He feels very well-informed but is still undecided on treatment.     Past Medical History  1. History of BPH (benign prostatic hyperplasia) (N40.0)  2. History of hyperlipidemia (Z86.39)  3. History of migraine headaches (Z86.69)  4. History of OSA on CPAP (G47.33)  Surgical History  1. History of Appendectomy  2. History of Total Hip Replacement  Current Meds  1. CPAP;  Therapy: (Recorded:03Dec2015) to Recorded  2. Imitrex 100 MG Oral Tablet;  Therapy: (Recorded:03Dec2015) to Recorded  3. Nystatin-Triamcinolone 952841-3.2 UNIT/GM-% External Cream;  Therapy: (Recorded:03Dec2015) to Recorded  4. Simvastatin 40 MG Oral  Tablet;  Therapy: (Recorded:03Dec2015) to Recorded  Allergies  1. ampicillin  Family History  1. Family history of Amputee : Brother  2. Family history of CAD (coronary artery disease) : Mother  3. Family history of alcoholism (Z81.1) : Brother  4. Family history of cerebrovascular accident (CVA) (Z82.3) : Mother  5. Family history of deep venous thrombosis (Z82.49) : Brother  6. Family history of hypertension (Z82.49) : Mother  7. Family history of peptic ulcer (Z83.79) : Father  8. Family history of Overweight : Mother  89. Family history of S/P CABG (coronary artery bypass graft) : Mother  Social History   Alcohol use (F10.99)   Former smoker [phone])   Married  Physical Exam Constitutional: Well nourished and well developed . No acute distress.    Results/Data  We have reviewed his pathology slides, PSA results, and medical records today in the multidisciplinary conference.  Findings are as dictated above.  Specifically, on review of his pathology slides, it was clear that he definitely has small volume low-grade disease with some glands on the borderline of being simply atypical glands versus Gleason 6 prostate cancer.     Assessment  1. Prostate cancer (C61)  Discussion/Summary  1.  Prostate cancer: I had a detailed discussion with Mr. Tapley and his wife today regarding his low risk prostate cancer.  We have reviewed all of his diagnostic parameters and reviewed options for management/treatment.  He does feel fairly sure that he is going to decide between active surveillance and surgical treatment.  Reviewing his situation, he understands that he does have a low-grade and low risk cancer.  However, he does technically have some factors that raise concern for recommending surveillance including more than 3 cores involvement and a PSA density greater than 0.15.  It was explained the PSA density greater than 0.15 has been shown to be an independent predictor for progression  for men on active surveillance.  I made it clear that if he chooses to receive with surveillance, that he certainly would be at risk of necessitating curative treatment in the future although this may allow him to delay that inevitable treatment and the potential side effects associated with it.  We also discussed the importance of determining his priorities with regard to his quality of life versus quality of life.   We have had a detailed discussion about the specific risks of long-term and short-term related to surgical treatment including urinary incontinence and erectile dysfunction.  He does have significant baseline voiding symptoms and understands that these eventually could be improved after surgical treatment and recovery of continence.  We discussed the timeframe regard to recovery as well as the timeframe of when he should make a decision on a treatment plan.  Mrs. Chittick is very concerned about her husband's prostate cancer.  She has had multiple family members who have died of metastatic cancer and is very sensitive to this diagnosis and strongly wishes her husband to proceed with definitive surgical therapy.  We discussed how his prostate cancer is certainly much different than other cancer situations.   I answered numerous questions for the patient and his wife today and they seem to be satisfied with all their questions answered at this time.  He is going to make a decision between active surveillance and definitive surgical therapy.  Currently, he would like to discuss his options further with his wife.  Considering some of the differences in their mind set between the priorities for management of his prostate cancer, I also did offer them the option of psychological counseling to help him work through that particular issue.  They will consider their options and he will plan to contact me with his decision on how he would like to proceed.  He understands he has the option of simply following  up for further discussion versus proceeding with surgical intervention versus proceeding with active surveillance.  If he does proceed with active surveillance, we discussed having him undergo an MRI of the prostate followed by a repeat prostate biopsy with additional samples for further evaluation prior to proceeding with surveillance.  We also discussed the potential benefits of an Oncotype DX testing help predict his radical prostatectomy pathology as an adjunct to assessing his candidacy for active surveillance.  If he does proceed with surgical treatment, he understands that we would receive with a bilateral nerve sparing robotic-assisted laparoscopic radical prostatectomy.  We reviewed some of the issues related to recovery and preoperative education/training.  I certainly would have further discussion with him and his wife prior to surgical intervention regarding the potential risks of treatment.    A total of 65 minutes were spent in the overall care of the patient today with 65 minutes in direct face to face consultation.    Amendment  Cc: Dr. Tyler Pita Dr. Zola Button Dr. Tonna Boehringer   Signatures Electronically signed by : Raynelle Bring, M.D.; Nov 02 2014 12:50PM EST Electronically signed by : Raynelle Bring, M.D.; Nov 02 2014 12:51PM EST

## 2014-11-02 NOTE — Progress Notes (Signed)
Please see consult note.  

## 2014-11-02 NOTE — Progress Notes (Signed)
Radiation Oncology         [phone] ________________________________  Multidisciplinary Prostate Cancer Clinic  Initial Radiation Oncology Consultation  Name: Vincent Daugherty MRN: [id number]  Date: 11/02/2014  DOB: [birth year]  IO:EVOJ, [name], MD  Raynelle Bring, MD   REFERRING PHYSICIAN: Raynelle Bring, MD  DIAGNOSIS: 59 y.o. gentleman with stage T1c adenocarcinoma of the prostate with a Gleason's score of 3+3 and a PSA of 6.2    ICD-9-CM ICD-10-CM   1. Malignant neoplasm of prostate Vincent Daugherty is a 60 y.o. gentleman.  He was noted to have an elevated PSA of 6.2 by his primary care physician, Dr. Brigitte Pulse.  Accordingly, he was referred for evaluation in urology by Dr. Alinda Money, and digital rectal examination revealed no nodule.  The patient proceeded to transrectal ultrasound with 12 biopsies of the prostate on 12/8.  The prostate volume measured 26.7 cc.  Out of 12 core biopsies,4 were positive.  The maximum Gleason score was 3+3, and this was seen in the distribution below:  The patient reviewed the biopsy results with his urologist and he has kindly been referred today to the multidisciplinary prostate cancer clinic for presentation of pathology and radiology studies in our conference for discussion of potential radiation treatment options and clinical evaluation.  PREVIOUS RADIATION THERAPY: No  PAST MEDICAL HISTORY:  has a past medical history of Other and unspecified hyperlipidemia; Migraine, unspecified, without mention of intractable migraine without mention of status migrainosus; Obstructive sleep apnea (adult) (pediatric); Nocturia; Impotence of organic origin; Personal history of other diseases of digestive system; Need for prophylactic vaccination and inoculation against influenza; OSA (obstructive sleep apnea) (08/31/2013); and Prostate cancer.    PAST SURGICAL HISTORY: Past Surgical History  Procedure Laterality Date  . Sp  thromboect prim mech add - sep      anterior approach  . Prostate biopsy    . Appendectomy    . Total hip arthroplasty      FAMILY HISTORY: family history includes CAD in his mother; Hypertension in his mother; Stroke in his mother; Ulcers in his father.  SOCIAL HISTORY:  reports that he quit smoking about 3 years ago. His smoking use included Cigarettes. He smoked 0.50 packs per day. He has never used smokeless tobacco. He reports that he does not drink alcohol or use illicit drugs.  ALLERGIES: Ampicillin  MEDICATIONS:  Current Outpatient Prescriptions  Medication Sig Dispense Refill  . aspirin-acetaminophen-caffeine (EXCEDRIN MIGRAINE) 250-250-65 MG per tablet Take by mouth every 6 (six) hours as needed for headache.    . Cholecalciferol (VITAMIN D-3 PO) Take by mouth.    . CIALIS 5 MG tablet     . clotrimazole (LOTRIMIN) 1 % cream Apply 1 application topically 2 (two) times daily.    . naproxen sodium (ANAPROX) 220 MG tablet Take 220 mg by mouth 2 (two) times daily with a meal.    . nystatin cream (MYCOSTATIN)   0  . sildenafil (VIAGRA) 100 MG tablet Take 100 mg by mouth daily as needed for erectile dysfunction.    . simvastatin (ZOCOR) 40 MG tablet Take 1 tablet by mouth daily.    . SUMAtriptan (IMITREX) 100 MG tablet Take 1 tablet by mouth as needed.    . triamcinolone cream (KENALOG) 0.5 %   0   No current facility-administered medications for this encounter.    REVIEW OF SYSTEMS:  A 15 point review of systems is documented in the electronic medical  record. This was obtained by the nursing staff. However, I reviewed this with the patient to discuss relevant findings and make appropriate changes.  A comprehensive review of systems was negative..  The patient completed an IPSS and IIEF questionnaire.  His IPSS score was 20 indicating severe urinary outflow obstructive symptoms.  He indicated that his erectile function is able to complete sexual activity on some to most attempts.     PHYSICAL EXAM: This patient is in no acute distress.  He is alert and oriented.   height is 6\' 2"  (1.88 m) and weight is 235 lb 6.4 oz (106.777 kg). His oral temperature is 98 F (36.7 C). His blood pressure is 133/78 and his pulse is 62. His respiration is 16 and oxygen saturation is 100%.  He exhibits no respiratory distress or labored breathing.  He appears neurologically intact.  His mood is pleasant.  His affect is appropriate.  Please note the digital rectal exam findings described above.  KPS = 100  100 - Normal; no complaints; no evidence of disease. 90   - Able to carry on normal activity; minor signs or symptoms of disease. 80   - Normal activity with effort; some signs or symptoms of disease. 66   - Cares for self; unable to carry on normal activity or to do active work. 60   - Requires occasional assistance, but is able to care for most of his personal needs. 50   - Requires considerable assistance and frequent medical care. 68   - Disabled; requires special care and assistance. 30   - Severely disabled; hospital admission is indicated although death not imminent. 33   - Very sick; hospital admission necessary; active supportive treatment necessary. 10   - Moribund; fatal processes progressing rapidly. 0     - Dead  Karnofsky DA, Abelmann [place], Craver LS and Burchenal [facility] [phone]) The use of the nitrogen mustards in the palliative treatment of carcinoma: with particular reference to bronchogenic carcinoma Cancer 1 634-56   LABORATORY DATA:  Lab Results  Component Value Date   WBC 6.6 10/16/2010   HGB 7.9* 10/16/2010   HCT 24.0* 10/16/2010   MCV 90.6 10/16/2010   PLT 140* 10/16/2010   Lab Results  Component Value Date   NA 139 10/16/2010   K 4.4 10/16/2010   CL 109 10/16/2010   CO2 27 10/16/2010   No results found for: ALT, AST, GGT, ALKPHOS, BILITOT   RADIOGRAPHY: No results found.    IMPRESSION: This gentleman is a 59 y.o. gentleman with stage T1c adenocarcinoma of  the prostate with a Gleason's score of 3+3 and a PSA of 6.2.  His T-Stage, Gleason's Score, and PSA put him into the low risk group.  Accordingly he is eligible for a variety of potential treatment options including active surveillance, radical prostatectomy, or radiotherapy using external beam radiation versus prostate seed implant. He is not ideally suited for prostate seed implant given his high I PSS score indicating severe urinary obstructive outflow symptoms.  PLAN:Today I reviewed the findings and workup thus far.  We discussed the natural history of prostate cancer.  We reviewed the the implications of T-stage, Gleason's Score, and PSA on decision-making and outcomes related to prostate cancer.  We discussed radiation treatment in the management of prostate cancer with regard to the logistics and delivery of external beam radiation treatment as well as the logistics and delivery of prostate brachytherapy.  We compared and contrasted each of these approaches and also compared  these against prostatectomy.    The patient focused most of his questions and interest in prostatectomy versus active surveillance.  We discussed some of the potential advantages of surgery including surgical staging, the availability of salvage radiotherapy to the prostatic fossa, and the confidence associated with immediate biochemical response.  The patient remains undecided at this time and plans to follow-up with Dr. Raynelle Bring.  I enjoyed meeting with him today, and will look forward to participating in the care of this very nice gentleman.  I will look forward to following his progress   I spent 30 minutes minutes face to face with the patient and more than 50% of that time was spent in counseling and/or coordination of care.     ------------------------------------------------  Sheral Apley. Tammi Klippel, M.D.

## 2014-11-05 ENCOUNTER — Encounter: Payer: Self-pay | Admitting: *Deleted

## 2014-12-31 ENCOUNTER — Other Ambulatory Visit: Payer: Self-pay | Admitting: Urology

## 2015-01-24 NOTE — Patient Instructions (Addendum)
Vincent Daugherty  01/24/2015   Your procedure is scheduled on: MONDAY 02/04/2015  Report to [facility] Main  Entrance and follow signs to               [facility] at [place] AM.  Call this number if you have problems the morning of surgery [phone]               Remember:drink 8-10 oz. Magnesium citrate by noon day before surgery and follow instructions on bottle as per directed by dr.borden's office. USE FLEET'S ENEMA AS DIRECTED ON BOTTLE NIGHT BEFORE             SURGERY!   Day before surgery (02/03/15): ONLY clear liquids all day   Do not eat food or drink liquids: After Midnight.     Take these medicines the morning of surgery with A SIP OF WATER: NONE   CLEAR LIQUID DIET   Foods Allowed                                                                     Foods Excluded  Coffee and tea, regular and decaf                             liquids that you cannot  Plain Jell-O in any flavor                                             see through such as: Fruit ices (not with fruit pulp)                                     milk, soups, orange juice  Iced Popsicles                                    All solid food Carbonated beverages, regular and diet                                    Cranberry, grape and apple juices Sports drinks like Gatorade Lightly seasoned clear broth or consume(fat free) Sugar, honey syrup  Sample Menu Breakfast                                Lunch                                     Supper Cranberry juice                    Beef broth  Chicken broth Jell-O                                     Grape juice                           Apple juice Coffee or tea                        Jell-O                                      Popsicle                                                Coffee or tea                        Coffee or tea  _____________________________________________________________________                                  Vincent Daugherty may not have any metal on your body including hair pins and              piercings  Do not wear jewelry, make-up, lotions, powders or perfumes.             Do not wear nail polish.  Do not shave  48 hours prior to surgery.              Men may shave face and neck.   Do not bring valuables to the hospital. [place].  Contacts, dentures or bridgework may not be worn into surgery.  Leave suitcase in the car. After surgery it may be brought to your room.     Patients discharged the day of surgery will not be allowed to drive home.  Name and phone number of your driver:  Special Instructions: N/A              Please read over the following fact sheets you were given: _____________________________________________________________________             [facility] - Preparing for Surgery Before surgery, you can play an important role.  Because skin is not sterile, your skin needs to be as free of germs as possible.  You can reduce the number of germs on your skin by washing with CHG (chlorahexidine gluconate) soap before surgery.  CHG is an antiseptic cleaner which kills germs and bonds with the skin to continue killing germs even after washing. Please DO NOT use if you have an allergy to CHG or antibacterial soaps.  If your skin becomes reddened/irritated stop using the CHG and inform your nurse when you arrive at Short Stay. Do not shave (including legs and underarms) for at least 48 hours prior to the first CHG shower.  You may shave your face/neck. Please follow these instructions carefully:  1.  Shower with CHG Soap the night before surgery and the  morning of  Surgery.  2.  If you choose to wash your hair, wash your hair first as usual with your  normal  shampoo.  3.  After you shampoo, rinse your hair and body thoroughly to remove the  shampoo.                           4.  Use CHG as you would any other liquid  soap.  You can apply chg directly  to the skin and wash                       Gently with a scrungie or clean washcloth.  5.  Apply the CHG Soap to your body ONLY FROM THE NECK DOWN.   Do not use on face/ open                           Wound or open sores. Avoid contact with eyes, ears mouth and genitals (private parts).                       Wash face,  Genitals (private parts) with your normal soap.             6.  Wash thoroughly, paying special attention to the area where your surgery  will be performed.  7.  Thoroughly rinse your body with warm water from the neck down.  8.  DO NOT shower/wash with your normal soap after using and rinsing off  the CHG Soap.                9.  Pat yourself dry with a clean towel.            10.  Wear clean pajamas.            11.  Place clean sheets on your bed the night of your first shower and do not  sleep with pets. Day of Surgery : Do not apply any lotions/deodorants the morning of surgery.  Please wear clean clothes to the hospital/surgery center.  FAILURE TO FOLLOW THESE INSTRUCTIONS MAY RESULT IN THE CANCELLATION OF YOUR SURGERY PATIENT SIGNATURE_________________________________  NURSE SIGNATURE__________________________________  ________________________________________________________________________   Vincent Daugherty  An incentive spirometer is a tool that can help keep your lungs clear and active. This tool measures how well you are filling your lungs with each breath. Taking long deep breaths may help reverse or decrease the chance of developing breathing (pulmonary) problems (especially infection) following:  A long period of time when you are unable to move or be active. BEFORE THE PROCEDURE   If the spirometer includes an indicator to show your best effort, your nurse or respiratory therapist will set it to a desired goal.  If possible, sit up straight or lean slightly forward. Try not to slouch.  Hold the incentive spirometer  in an upright position. INSTRUCTIONS FOR USE   Sit on the edge of your bed if possible, or sit up as far as you can in bed or on a chair.  Hold the incentive spirometer in an upright position.  Breathe out normally.  Place the mouthpiece in your mouth and seal your lips tightly around it.  Breathe in slowly and as deeply as possible, raising the piston or the ball toward the top of the column.  Hold your breath for 3-5 seconds or for as long as possible. Allow  the piston or ball to fall to the bottom of the column.  Remove the mouthpiece from your mouth and breathe out normally.  Rest for a few seconds and repeat Steps 1 through 7 at least 10 times every 1-2 hours when you are awake. Take your time and take a few normal breaths between deep breaths.  The spirometer may include an indicator to show your best effort. Use the indicator as a goal to work toward during each repetition.  After each set of 10 deep breaths, practice coughing to be sure your lungs are clear. If you have an incision (the cut made at the time of surgery), support your incision when coughing by placing a pillow or rolled up towels firmly against it. Once you are able to get out of bed, walk around indoors and cough well. You may stop using the incentive spirometer when instructed by your caregiver.  RISKS AND COMPLICATIONS  Take your time so you do not get dizzy or light-headed.  If you are in pain, you may need to take or ask for pain medication before doing incentive spirometry. It is harder to take a deep breath if you are having pain. AFTER USE  Rest and breathe slowly and easily.  It can be helpful to keep track of a log of your progress. Your caregiver can provide you with a simple table to help with this. If you are using the spirometer at home, follow these instructions: [place] IF:   You are having difficultly using the spirometer.  You have trouble using the spirometer as often as  instructed.  Your pain medication is not giving enough relief while using the spirometer.  You develop fever of 100.5 F (38.1 C) or higher. SEEK IMMEDIATE MEDICAL CARE IF:   You cough up bloody sputum that had not been present before.  You develop fever of 102 F (38.9 C) or greater.  You develop worsening pain at or near the incision site. MAKE SURE YOU:   Understand these instructions.  Will watch your condition.  Will get help right away if you are not doing well or get worse. Document Released: 02/22/2007 Document Revised: 01/04/2012 Document Reviewed: 04/25/2007 Exit[employer] Patient Information 2014 Exit[employer], [place].   ________________________________________________________________________  WHAT IS A BLOOD TRANSFUSION? Blood Transfusion Information  A transfusion is the replacement of blood or some of its parts. Blood is made up of multiple cells which provide different functions.  Red blood cells carry oxygen and are used for blood loss replacement.  White blood cells fight against infection.  Platelets control bleeding.  Plasma helps clot blood.  Other blood products are available for specialized needs, such as hemophilia or other clotting disorders. BEFORE THE TRANSFUSION  Who gives blood for transfusions?   Healthy volunteers who are fully evaluated to make sure their blood is safe. This is blood bank blood. Transfusion therapy is the safest it has ever been in the practice of medicine. Before blood is taken from a donor, a complete history is taken to make sure that person has no history of diseases nor engages in risky social behavior (examples are intravenous drug use or sexual activity with multiple partners). The donor's travel history is screened to minimize risk of transmitting infections, such as malaria. The donated blood is tested for signs of infectious diseases, such as HIV and hepatitis. The blood is then tested to be sure it is compatible with you in  order to minimize the chance of a transfusion reaction.  If you or a relative donates blood, this is often done in anticipation of surgery and is not appropriate for emergency situations. It takes many days to process the donated blood. RISKS AND COMPLICATIONS Although transfusion therapy is very safe and saves many lives, the main dangers of transfusion include:   Getting an infectious disease.  Developing a transfusion reaction. This is an allergic reaction to something in the blood you were given. Every precaution is taken to prevent this. The decision to have a blood transfusion has been considered carefully by your caregiver before blood is given. Blood is not given unless the benefits outweigh the risks. AFTER THE TRANSFUSION  Right after receiving a blood transfusion, you will usually feel much better and more energetic. This is especially true if your red blood cells have gotten low (anemic). The transfusion raises the level of the red blood cells which carry oxygen, and this usually causes an energy increase.  The nurse administering the transfusion will monitor you carefully for complications. HOME CARE INSTRUCTIONS  No special instructions are needed after a transfusion. You may find your energy is better. Speak with your caregiver about any limitations on activity for underlying diseases you may have. SEEK MEDICAL CARE IF:   Your condition is not improving after your transfusion.  You develop redness or irritation at the intravenous (IV) site. SEEK IMMEDIATE MEDICAL CARE IF:  Any of the following symptoms occur over the next 12 hours:  Shaking chills.  You have a temperature by mouth above 102 F (38.9 C), not controlled by medicine.  Chest, back, or muscle pain.  People around you feel you are not acting correctly or are confused.  Shortness of breath or difficulty breathing.  Dizziness and fainting.  You get a rash or develop hives.  You have a decrease in urine  output.  Your urine turns a dark color or changes to pink, red, or brown. Any of the following symptoms occur over the next 10 days:  You have a temperature by mouth above 102 F (38.9 C), not controlled by medicine.  Shortness of breath.  Weakness after normal activity.  The white part of the eye turns yellow (jaundice).  You have a decrease in the amount of urine or are urinating less often.  Your urine turns a dark color or changes to pink, red, or brown. Document Released: 10/09/2000 Document Revised: 01/04/2012 Document Reviewed: 05/28/2008 [facility] Patient Information 2014 [place], [place].  _______________________________________________________________________

## 2015-01-25 ENCOUNTER — Encounter (HOSPITAL_COMMUNITY)
Admission: RE | Admit: 2015-01-25 | Discharge: 2015-01-25 | Disposition: A | Discharge status: Home / Self Care | Payer: BLUE CROSS/BLUE SHIELD | Source: Ambulatory Visit | Attending: Urology | Admitting: Urology

## 2015-01-25 ENCOUNTER — Encounter (HOSPITAL_COMMUNITY): Payer: Self-pay

## 2015-01-25 ENCOUNTER — Ambulatory Visit (HOSPITAL_COMMUNITY)
Admission: RE | Admit: 2015-01-25 | Discharge: 2015-01-25 | Disposition: A | Discharge status: Home / Self Care | Payer: BLUE CROSS/BLUE SHIELD | Source: Ambulatory Visit | Attending: Urology | Admitting: Urology

## 2015-01-25 DIAGNOSIS — Z01812 Encounter for preprocedural laboratory examination: Secondary | ICD-10-CM | POA: Diagnosis not present

## 2015-01-25 DIAGNOSIS — Z01810 Encounter for preprocedural cardiovascular examination: Secondary | ICD-10-CM | POA: Insufficient documentation

## 2015-01-25 DIAGNOSIS — Z01818 Encounter for other preprocedural examination: Secondary | ICD-10-CM

## 2015-01-25 HISTORY — DX: Adverse effect of unspecified anesthetic, initial encounter: T41.45XA

## 2015-01-25 HISTORY — DX: Other complications of anesthesia, initial encounter: T88.59XA

## 2015-01-25 LAB — BASIC METABOLIC PANEL
ANION GAP: 8 (ref 5–15)
BUN: 23 mg/dL (ref 6–23)
CO2: 25 mmol/L (ref 19–32)
Calcium: 9.4 mg/dL (ref 8.4–10.5)
Chloride: 105 mmol/L (ref 96–112)
Creatinine, Ser: 1.27 mg/dL (ref 0.50–1.35)
GFR calc non Af Amer: 60 mL/min — ABNORMAL LOW (ref >90)
GFR, EST AFRICAN AMERICAN: 70 mL/min — AB (ref >90)
Glucose, Bld: 91 mg/dL (ref 70–99)
Potassium: 4.2 mmol/L (ref 3.5–5.1)
Sodium: 138 mmol/L (ref 135–145)

## 2015-01-25 LAB — CBC
HCT: 41.9 % (ref 39.0–52.0)
HEMOGLOBIN: 13.7 g/dL (ref 13.0–17.0)
MCH: 29.1 pg (ref 26.0–34.0)
MCHC: 32.7 g/dL (ref 30.0–36.0)
MCV: 89.1 fL (ref 78.0–100.0)
Platelets: 184 10*3/uL (ref 150–400)
RBC: 4.7 MIL/uL (ref 4.22–5.81)
RDW: 13 % (ref 11.5–15.5)
WBC: 5 10*3/uL (ref 4.0–10.5)

## 2015-02-01 NOTE — H&P (Signed)
  History of Present Illness Mr. Gauger is a 59 year old gentleman who was noted to have an elevated PSA of 6.2. He underwent a prostate needle biopsy on 10/02/14 that demonstrated Gleason 3+3=6 adenocarcinoma of the prostate in 4 out of 12 biopsy cores. He has no family history of prostate cancer.  Overall, he is very healthy. His only comorbidities include obstructive sleep apnea and dyslipidemia.  TNM stage: cT1c Nx Mx PSA: 6.2 Gleason score: 3+3=6 Biopsy (10/02/14): 4/12 cores positive   Left: L mid (10%), L base (15%)   Right: R mid (20%), R lateral mid (5%) Prostate volume: 25.7 cc PSAD: 0.24  Nomogram OC disease: 56% EPE: 44% SVI: 1% LNI: 1% PFS (surgery): 93% at 5 years, 88% at 10 years  Urinary function: He has a history of BPH and LUTS but has not required treatment. IPSS is 20. Erectile function: He has mild erectile dysfunction that has not required treatment. SHIM score is 19.     Past Medical History Problems  1. History of BPH (benign prostatic hyperplasia) (N40.0) 2. History of hyperlipidemia (Z86.39) 3. History of migraine headaches (Z86.69) 4. History of OSA on CPAP (G47.33)  Surgical History Problems  1. History of Appendectomy 2. History of Total Hip Replacement  Current Meds 1. CPAP;  Therapy: (Recorded:03Dec2015) to Recorded 2. Imitrex 100 MG Oral Tablet;  Therapy: (Recorded:03Dec2015) to Recorded 3. Nystatin-Triamcinolone 465681-2.7 UNIT/GM-% External Cream;  Therapy: (Recorded:03Dec2015) to Recorded 4. Simvastatin 40 MG Oral Tablet;  Therapy: (Recorded:03Dec2015) to Recorded  Allergies Medication  1. ampicillin  Family History Problems  1. Family history of Amputee : Brother 2. Family history of CAD (coronary artery disease) : Mother 3. Family history of alcoholism (Z81.1) : Brother 4. Family history of cerebrovascular accident (CVA) (Z82.3) : Mother 5. Family history of deep venous thrombosis (Z82.49) : Brother 6. Family history of  hypertension (Z82.49) : Mother 7. Family history of peptic ulcer (Z83.79) : Father 8. Family history of Overweight : Mother 20. Family history of S/P CABG (coronary artery bypass graft) : Mother  Social History Problems  1. Alcohol use (Z78.9)   drinks alcohol only occassionally 2. Former smoker [phone])   quit smoking in 1981 3. Married   with 3 sons    Physical Exam Constitutional: Well nourished and well developed . No acute distress.  ENT:. The ears and nose are normal in appearance.  Neck: The appearance of the neck is normal and no neck mass is present.  Pulmonary: No respiratory distress and normal respiratory rhythm and effort.  Cardiovascular: Heart rate and rhythm are normal . No peripheral edema.  Abdomen: The abdomen is soft and nontender. No masses are palpated. No CVA tenderness. No hernias are palpable. No hepatosplenomegaly noted.  Neuro/Psych:. Mood and affect are appropriate.    Assessment Assessed  1. Prostate cancer (C61)    Discussion/Summary 1. Prostate cancer: Mr. Shedd will proceed with surgical treatment with a BNS RAL radical prostatectomy.

## 2015-02-04 ENCOUNTER — Inpatient Hospital Stay (HOSPITAL_COMMUNITY): Payer: BLUE CROSS/BLUE SHIELD | Admitting: Anesthesiology

## 2015-02-04 ENCOUNTER — Encounter (HOSPITAL_COMMUNITY): Payer: Self-pay | Admitting: *Deleted

## 2015-02-04 ENCOUNTER — Inpatient Hospital Stay (HOSPITAL_COMMUNITY)
Admission: RE | Admit: 2015-02-04 | Discharge: 2015-02-05 | DRG: 708 | Disposition: A | Discharge status: Home / Self Care | Payer: BLUE CROSS/BLUE SHIELD | Source: Ambulatory Visit | Attending: Urology | Admitting: Urology

## 2015-02-04 ENCOUNTER — Encounter (HOSPITAL_COMMUNITY)
Admission: RE | Disposition: A | Discharge status: Home / Self Care | Payer: Self-pay | Source: Ambulatory Visit | Attending: Urology

## 2015-02-04 DIAGNOSIS — N400 Enlarged prostate without lower urinary tract symptoms: Secondary | ICD-10-CM | POA: Diagnosis present

## 2015-02-04 DIAGNOSIS — C61 Malignant neoplasm of prostate: Secondary | ICD-10-CM | POA: Diagnosis present

## 2015-02-04 DIAGNOSIS — Z79899 Other long term (current) drug therapy: Secondary | ICD-10-CM

## 2015-02-04 DIAGNOSIS — Z87891 Personal history of nicotine dependence: Secondary | ICD-10-CM | POA: Diagnosis not present

## 2015-02-04 DIAGNOSIS — E785 Hyperlipidemia, unspecified: Secondary | ICD-10-CM | POA: Diagnosis present

## 2015-02-04 DIAGNOSIS — Z96649 Presence of unspecified artificial hip joint: Secondary | ICD-10-CM | POA: Diagnosis present

## 2015-02-04 DIAGNOSIS — G4733 Obstructive sleep apnea (adult) (pediatric): Secondary | ICD-10-CM | POA: Diagnosis present

## 2015-02-04 DIAGNOSIS — Z880 Allergy status to penicillin: Secondary | ICD-10-CM | POA: Diagnosis not present

## 2015-02-04 DIAGNOSIS — G43909 Migraine, unspecified, not intractable, without status migrainosus: Secondary | ICD-10-CM | POA: Diagnosis present

## 2015-02-04 HISTORY — PX: ROBOT ASSISTED LAPAROSCOPIC RADICAL PROSTATECTOMY: SHX5141

## 2015-02-04 LAB — TYPE AND SCREEN
ABO/RH(D): B POS
Antibody Screen: NEGATIVE

## 2015-02-04 LAB — HEMOGLOBIN AND HEMATOCRIT, BLOOD
HCT: 42 % (ref 39.0–52.0)
Hemoglobin: 13.6 g/dL (ref 13.0–17.0)

## 2015-02-04 SURGERY — ROBOTIC ASSISTED LAPAROSCOPIC RADICAL PROSTATECTOMY LEVEL 1
Anesthesia: General

## 2015-02-04 MED ORDER — PROPOFOL 10 MG/ML IV BOLUS
INTRAVENOUS | Status: AC
  Filled 2015-02-04: qty 20

## 2015-02-04 MED ORDER — HYDROMORPHONE HCL 1 MG/ML IJ SOLN
0.2500 mg | INTRAMUSCULAR | Status: DC | PRN
  Administered 2015-02-04 (×3): 0.5 mg via INTRAVENOUS

## 2015-02-04 MED ORDER — ATROPINE SULFATE 0.4 MG/ML IJ SOLN
INTRAMUSCULAR | Status: DC | PRN
  Administered 2015-02-04: 0.2 mg via INTRAVENOUS

## 2015-02-04 MED ORDER — CEFAZOLIN SODIUM-DEXTROSE 2-3 GM-% IV SOLR
2.0000 g | INTRAVENOUS | Status: AC
  Administered 2015-02-04: 2 g via INTRAVENOUS

## 2015-02-04 MED ORDER — PROMETHAZINE HCL 25 MG/ML IJ SOLN: 6.2500 mg | INTRAMUSCULAR | Status: DC | PRN

## 2015-02-04 MED ORDER — DEXAMETHASONE SODIUM PHOSPHATE 10 MG/ML IJ SOLN
INTRAMUSCULAR | Status: AC
  Filled 2015-02-04: qty 1

## 2015-02-04 MED ORDER — CISATRACURIUM BESYLATE (PF) 10 MG/5ML IV SOLN
INTRAVENOUS | Status: DC | PRN
  Administered 2015-02-04 (×3): 2 mg via INTRAVENOUS
  Administered 2015-02-04: 4 mg via INTRAVENOUS

## 2015-02-04 MED ORDER — SODIUM CHLORIDE 0.9 % IV BOLUS (SEPSIS)
1000.0000 mL | Freq: Once | INTRAVENOUS | Status: AC
  Administered 2015-02-04: 1000 mL via INTRAVENOUS

## 2015-02-04 MED ORDER — ONDANSETRON HCL 4 MG/2ML IJ SOLN
INTRAMUSCULAR | Status: DC | PRN
  Administered 2015-02-04: 4 mg via INTRAVENOUS

## 2015-02-04 MED ORDER — PHENYLEPHRINE HCL 10 MG/ML IJ SOLN
INTRAMUSCULAR | Status: DC | PRN
  Administered 2015-02-04: 40 ug via INTRAVENOUS
  Administered 2015-02-04: 80 ug via INTRAVENOUS

## 2015-02-04 MED ORDER — DOCUSATE SODIUM 100 MG PO CAPS
100.0000 mg | ORAL_CAPSULE | Freq: Two times a day (BID) | ORAL | Status: DC
  Administered 2015-02-04 – 2015-02-05 (×3): 100 mg via ORAL
  Filled 2015-02-04 (×3): qty 1

## 2015-02-04 MED ORDER — HYDROCODONE-ACETAMINOPHEN 5-325 MG PO TABS: 1.0000 | ORAL_TABLET | Freq: Four times a day (QID) | ORAL | Status: DC | PRN

## 2015-02-04 MED ORDER — MIDAZOLAM HCL 2 MG/2ML IJ SOLN
INTRAMUSCULAR | Status: AC
  Filled 2015-02-04: qty 2

## 2015-02-04 MED ORDER — MENTHOL 3 MG MT LOZG: 1.0000 | LOZENGE | OROMUCOSAL | Status: DC | PRN

## 2015-02-04 MED ORDER — NEOSTIGMINE METHYLSULFATE 10 MG/10ML IV SOLN
INTRAVENOUS | Status: DC | PRN
  Administered 2015-02-04: 5 mg via INTRAVENOUS

## 2015-02-04 MED ORDER — METOCLOPRAMIDE HCL 5 MG/ML IJ SOLN
INTRAMUSCULAR | Status: DC | PRN
  Administered 2015-02-04: 10 mg via INTRAVENOUS

## 2015-02-04 MED ORDER — NEOSTIGMINE METHYLSULFATE 10 MG/10ML IV SOLN
INTRAVENOUS | Status: AC
  Filled 2015-02-04: qty 1

## 2015-02-04 MED ORDER — LACTATED RINGERS IV SOLN: INTRAVENOUS | Status: DC

## 2015-02-04 MED ORDER — ONDANSETRON HCL 4 MG/2ML IJ SOLN
4.0000 mg | INTRAMUSCULAR | Status: DC | PRN
  Administered 2015-02-04 (×2): 4 mg via INTRAVENOUS
  Filled 2015-02-04 (×2): qty 2

## 2015-02-04 MED ORDER — KCL IN DEXTROSE-NACL 20-5-0.45 MEQ/L-%-% IV SOLN
INTRAVENOUS | Status: AC
Start: 2015-02-04 — End: 2015-02-04
  Filled 2015-02-04: qty 1000

## 2015-02-04 MED ORDER — DEXAMETHASONE SODIUM PHOSPHATE 10 MG/ML IJ SOLN
INTRAMUSCULAR | Status: DC | PRN
  Administered 2015-02-04: 10 mg via INTRAVENOUS

## 2015-02-04 MED ORDER — SUFENTANIL CITRATE 50 MCG/ML IV SOLN
INTRAVENOUS | Status: DC | PRN
  Administered 2015-02-04: 5 ug via INTRAVENOUS
  Administered 2015-02-04: 10 ug via INTRAVENOUS
  Administered 2015-02-04 (×4): 5 ug via INTRAVENOUS

## 2015-02-04 MED ORDER — OXYMETAZOLINE HCL 0.05 % NA SOLN
1.0000 | Freq: Two times a day (BID) | NASAL | Status: DC | PRN
  Filled 2015-02-04: qty 15

## 2015-02-04 MED ORDER — HYDROMORPHONE HCL 1 MG/ML IJ SOLN
INTRAMUSCULAR | Status: AC
  Filled 2015-02-04: qty 1

## 2015-02-04 MED ORDER — BUPIVACAINE-EPINEPHRINE (PF) 0.25% -1:200000 IJ SOLN
INTRAMUSCULAR | Status: AC
Start: 2015-02-04 — End: 2015-02-04
  Filled 2015-02-04: qty 30

## 2015-02-04 MED ORDER — SUMATRIPTAN SUCCINATE 50 MG PO TABS
100.0000 mg | ORAL_TABLET | ORAL | Status: DC | PRN
Start: 2015-02-04 — End: 2015-02-05
  Administered 2015-02-04: 100 mg via ORAL
  Filled 2015-02-04: qty 1
  Filled 2015-02-04: qty 2

## 2015-02-04 MED ORDER — ACETAMINOPHEN 325 MG PO TABS: 650.0000 mg | ORAL_TABLET | ORAL | Status: DC | PRN

## 2015-02-04 MED ORDER — HEPARIN SODIUM (PORCINE) 1000 UNIT/ML IJ SOLN
INTRAMUSCULAR | Status: DC | PRN
  Administered 2015-02-04: 08:00:00

## 2015-02-04 MED ORDER — PHENYLEPHRINE 40 MCG/ML (10ML) SYRINGE FOR IV PUSH (FOR BLOOD PRESSURE SUPPORT)
PREFILLED_SYRINGE | INTRAVENOUS | Status: AC
  Filled 2015-02-04: qty 10

## 2015-02-04 MED ORDER — SODIUM CHLORIDE 0.9 % IJ SOLN
INTRAMUSCULAR | Status: AC
Start: 2015-02-04 — End: 2015-02-04
  Filled 2015-02-04: qty 10

## 2015-02-04 MED ORDER — LORATADINE 10 MG PO TABS
10.0000 mg | ORAL_TABLET | Freq: Every morning | ORAL | Status: DC
  Administered 2015-02-04 – 2015-02-05 (×2): 10 mg via ORAL
  Filled 2015-02-04 (×2): qty 1

## 2015-02-04 MED ORDER — CEFAZOLIN SODIUM-DEXTROSE 2-3 GM-% IV SOLR
INTRAVENOUS | Status: AC
  Filled 2015-02-04: qty 50

## 2015-02-04 MED ORDER — HEPARIN SODIUM (PORCINE) 1000 UNIT/ML IJ SOLN
INTRAMUSCULAR | Status: AC
  Filled 2015-02-04: qty 2

## 2015-02-04 MED ORDER — KETOROLAC TROMETHAMINE 15 MG/ML IJ SOLN
15.0000 mg | Freq: Four times a day (QID) | INTRAMUSCULAR | Status: DC
  Administered 2015-02-04 – 2015-02-05 (×5): 15 mg via INTRAVENOUS
  Filled 2015-02-04 (×5): qty 1

## 2015-02-04 MED ORDER — MORPHINE SULFATE 2 MG/ML IJ SOLN
2.0000 mg | INTRAMUSCULAR | Status: DC | PRN
  Administered 2015-02-04: 2 mg via INTRAVENOUS
  Filled 2015-02-04: qty 1

## 2015-02-04 MED ORDER — GLYCOPYRROLATE 0.2 MG/ML IJ SOLN
INTRAMUSCULAR | Status: DC | PRN
  Administered 2015-02-04: .8 mg via INTRAVENOUS
  Administered 2015-02-04: 0.2 mg via INTRAVENOUS

## 2015-02-04 MED ORDER — BUPIVACAINE-EPINEPHRINE 0.25% -1:200000 IJ SOLN
INTRAMUSCULAR | Status: DC | PRN
  Administered 2015-02-04: 30 mL

## 2015-02-04 MED ORDER — HYDROMORPHONE HCL 1 MG/ML IJ SOLN
INTRAMUSCULAR | Status: DC | PRN
  Administered 2015-02-04 (×4): 0.5 mg via INTRAVENOUS

## 2015-02-04 MED ORDER — MEPERIDINE HCL 50 MG/ML IJ SOLN: 6.2500 mg | INTRAMUSCULAR | Status: DC | PRN

## 2015-02-04 MED ORDER — PHENOL 1.4 % MT LIQD: 1.0000 | OROMUCOSAL | Status: DC | PRN

## 2015-02-04 MED ORDER — GLYCOPYRROLATE 0.2 MG/ML IJ SOLN
INTRAMUSCULAR | Status: AC
Start: 2015-02-04 — End: 2015-02-04
  Filled 2015-02-04: qty 1

## 2015-02-04 MED ORDER — LIDOCAINE HCL (CARDIAC) 20 MG/ML IV SOLN
INTRAVENOUS | Status: AC
  Filled 2015-02-04: qty 5

## 2015-02-04 MED ORDER — HYDROMORPHONE HCL 2 MG/ML IJ SOLN
INTRAMUSCULAR | Status: AC
  Filled 2015-02-04: qty 1

## 2015-02-04 MED ORDER — SODIUM CHLORIDE 0.9 % IR SOLN
Status: DC | PRN
  Administered 2015-02-04: 1000 mL via INTRAVESICAL

## 2015-02-04 MED ORDER — CISATRACURIUM BESYLATE 20 MG/10ML IV SOLN
INTRAVENOUS | Status: AC
  Filled 2015-02-04: qty 10

## 2015-02-04 MED ORDER — SIMVASTATIN 40 MG PO TABS
40.0000 mg | ORAL_TABLET | Freq: Every day | ORAL | Status: DC
  Administered 2015-02-04: 40 mg via ORAL
  Filled 2015-02-04: qty 1

## 2015-02-04 MED ORDER — ROCURONIUM BROMIDE 100 MG/10ML IV SOLN
INTRAVENOUS | Status: DC | PRN
  Administered 2015-02-04: 50 mg via INTRAVENOUS

## 2015-02-04 MED ORDER — STERILE WATER FOR IRRIGATION IR SOLN
Status: DC | PRN
  Administered 2015-02-04: 3000 mL

## 2015-02-04 MED ORDER — ONDANSETRON HCL 4 MG/2ML IJ SOLN
INTRAMUSCULAR | Status: AC
  Filled 2015-02-04: qty 2

## 2015-02-04 MED ORDER — CIPROFLOXACIN HCL 500 MG PO TABS: 500.0000 mg | ORAL_TABLET | Freq: Two times a day (BID) | ORAL | Status: DC

## 2015-02-04 MED ORDER — GLYCOPYRROLATE 0.2 MG/ML IJ SOLN
INTRAMUSCULAR | Status: AC
  Filled 2015-02-04: qty 3

## 2015-02-04 MED ORDER — KCL IN DEXTROSE-NACL 20-5-0.45 MEQ/L-%-% IV SOLN
INTRAVENOUS | Status: DC
  Administered 2015-02-04 – 2015-02-05 (×4): via INTRAVENOUS
  Filled 2015-02-04 (×4): qty 1000

## 2015-02-04 MED ORDER — PROPOFOL 10 MG/ML IV BOLUS
INTRAVENOUS | Status: DC | PRN
  Administered 2015-02-04: 200 mg via INTRAVENOUS

## 2015-02-04 MED ORDER — LACTATED RINGERS IV SOLN
INTRAVENOUS | Status: DC | PRN
  Administered 2015-02-04 (×2): via INTRAVENOUS

## 2015-02-04 MED ORDER — ROCURONIUM BROMIDE 100 MG/10ML IV SOLN
INTRAVENOUS | Status: AC
  Filled 2015-02-04: qty 1

## 2015-02-04 MED ORDER — MIDAZOLAM HCL 5 MG/5ML IJ SOLN
INTRAMUSCULAR | Status: DC | PRN
  Administered 2015-02-04: 2 mg via INTRAVENOUS

## 2015-02-04 MED ORDER — METOCLOPRAMIDE HCL 5 MG/ML IJ SOLN
INTRAMUSCULAR | Status: AC
  Filled 2015-02-04: qty 2

## 2015-02-04 MED ORDER — DIPHENHYDRAMINE HCL 12.5 MG/5ML PO ELIX: 12.5000 mg | ORAL_SOLUTION | Freq: Four times a day (QID) | ORAL | Status: DC | PRN

## 2015-02-04 MED ORDER — SUFENTANIL CITRATE 50 MCG/ML IV SOLN
INTRAVENOUS | Status: AC
  Filled 2015-02-04: qty 1

## 2015-02-04 MED ORDER — DIPHENHYDRAMINE HCL 50 MG/ML IJ SOLN
12.5000 mg | Freq: Four times a day (QID) | INTRAMUSCULAR | Status: DC | PRN
Start: 2015-02-04 — End: 2015-02-05

## 2015-02-04 MED ORDER — CEFAZOLIN SODIUM 1-5 GM-% IV SOLN
1.0000 g | Freq: Three times a day (TID) | INTRAVENOUS | Status: AC
  Administered 2015-02-04 (×2): 1 g via INTRAVENOUS
  Filled 2015-02-04 (×2): qty 50

## 2015-02-04 SURGICAL SUPPLY — 47 items
CABLE HIGH FREQUENCY MONO STRZ (ELECTRODE) ×2 IMPLANT
CATH FOLEY 2WAY SLVR 18FR 30CC (CATHETERS) ×2 IMPLANT
CATH ROBINSON RED A/P 16FR (CATHETERS) ×2 IMPLANT
CATH ROBINSON RED A/P 8FR (CATHETERS) ×2 IMPLANT
CATH TIEMANN FOLEY 18FR 5CC (CATHETERS) ×2 IMPLANT
CHLORAPREP W/TINT 26ML (MISCELLANEOUS) ×2 IMPLANT
CLIP LIGATING HEM O LOK PURPLE (MISCELLANEOUS) ×2 IMPLANT
CLOTH BEACON ORANGE TIMEOUT ST (SAFETY) ×2 IMPLANT
COVER SURGICAL LIGHT HANDLE (MISCELLANEOUS) ×2 IMPLANT
COVER TIP SHEARS 8 DVNC (MISCELLANEOUS) ×1 IMPLANT
COVER TIP SHEARS 8MM DA VINCI (MISCELLANEOUS) ×1
CUTTER ECHEON FLEX ENDO 45 340 (ENDOMECHANICALS) ×2 IMPLANT
DECANTER SPIKE VIAL GLASS SM (MISCELLANEOUS) ×1 IMPLANT
DRAPE SURG IRRIG POUCH 19X23 (DRAPES) ×2 IMPLANT
DRSG TEGADERM 4X4.75 (GAUZE/BANDAGES/DRESSINGS) ×2 IMPLANT
DRSG TEGADERM 6X8 (GAUZE/BANDAGES/DRESSINGS) ×4 IMPLANT
ELECT REM PT RETURN 9FT ADLT (ELECTROSURGICAL) ×2
ELECTRODE REM PT RTRN 9FT ADLT (ELECTROSURGICAL) ×1 IMPLANT
GLOVE BIO SURGEON STRL SZ 6.5 (GLOVE) ×2 IMPLANT
GLOVE BIOGEL M STRL SZ7.5 (GLOVE) ×4 IMPLANT
GOWN STRL REUS W/TWL LRG LVL3 (GOWN DISPOSABLE) ×6 IMPLANT
HOLDER FOLEY CATH W/STRAP (MISCELLANEOUS) ×2 IMPLANT
IV LACTATED RINGERS 1000ML (IV SOLUTION) ×2 IMPLANT
KIT ACCESSORY DA VINCI DISP (KITS) ×1
KIT ACCESSORY DVNC DISP (KITS) ×1 IMPLANT
LIQUID BAND (GAUZE/BANDAGES/DRESSINGS) ×1 IMPLANT
NDL SAFETY ECLIPSE 18X1.5 (NEEDLE) ×1 IMPLANT
NEEDLE HYPO 18GX1.5 SHARP (NEEDLE) ×2
PACK ROBOT UROLOGY CUSTOM (CUSTOM PROCEDURE TRAY) ×2 IMPLANT
RELOAD GREEN ECHELON 45 (STAPLE) ×3 IMPLANT
SET TUBE IRRIG SUCTION NO TIP (IRRIGATION / IRRIGATOR) ×2 IMPLANT
SHEET LAVH (DRAPES) ×1 IMPLANT
SOLUTION ELECTROLUBE (MISCELLANEOUS) ×2 IMPLANT
SUT ETHILON 3 0 PS 1 (SUTURE) ×2 IMPLANT
SUT MNCRL 3 0 RB1 (SUTURE) ×1 IMPLANT
SUT MNCRL 3 0 VIOLET RB1 (SUTURE) ×1 IMPLANT
SUT MNCRL AB 4-0 PS2 18 (SUTURE) ×4 IMPLANT
SUT MONOCRYL 3 0 RB1 (SUTURE) ×2
SUT VIC AB 0 CT1 27 (SUTURE) ×2
SUT VIC AB 0 CT1 27XBRD ANTBC (SUTURE) ×1 IMPLANT
SUT VIC AB 2-0 SH 27 (SUTURE) ×2
SUT VIC AB 2-0 SH 27X BRD (SUTURE) ×1 IMPLANT
SUT VICRYL 0 UR6 27IN ABS (SUTURE) ×4 IMPLANT
SYR 27GX1/2 1ML LL SAFETY (SYRINGE) ×2 IMPLANT
TOWEL OR 17X26 10 PK STRL BLUE (TOWEL DISPOSABLE) ×2 IMPLANT
TOWEL OR NON WOVEN STRL DISP B (DISPOSABLE) ×2 IMPLANT
WATER STERILE IRR 1500ML POUR (IV SOLUTION) ×4 IMPLANT

## 2015-02-04 NOTE — Anesthesia Preprocedure Evaluation (Addendum)
Anesthesia Evaluation  Patient identified by MRN, date of birth, ID band Patient awake    Reviewed: Allergy & Precautions, NPO status , Patient's Chart, lab work & pertinent test results  History of Anesthesia Complications (+) PONV  Airway Mallampati: II  TM Distance: >3 FB Neck ROM: Full    Dental no notable dental hx.    Pulmonary sleep apnea , former smoker,  breath sounds clear to auscultation  Pulmonary exam normal       Cardiovascular negative cardio ROS  Rhythm:Regular Rate:Normal     Neuro/Psych negative neurological ROS  negative psych ROS   GI/Hepatic negative GI ROS, Neg liver ROS,   Endo/Other  negative endocrine ROS  Renal/GU negative Renal ROS  negative genitourinary   Musculoskeletal negative musculoskeletal ROS (+)   Abdominal   Peds negative pediatric ROS (+)  Hematology negative hematology ROS (+)   Anesthesia Other Findings   Reproductive/Obstetrics negative OB ROS                            Anesthesia Physical Anesthesia Plan  ASA: II  Anesthesia Plan: General   Post-op Pain Management:    Induction: Intravenous  Airway Management Planned: Oral ETT  Additional Equipment:   Intra-op Plan:   Post-operative Plan: Extubation in OR  Informed Consent: I have reviewed the patients History and Physical, chart, labs and discussed the procedure including the risks, benefits and alternatives for the proposed anesthesia with the patient or authorized representative who has indicated his/her understanding and acceptance.   Dental advisory given  Plan Discussed with: CRNA  Anesthesia Plan Comments:         Anesthesia Quick Evaluation

## 2015-02-04 NOTE — Progress Notes (Signed)
Post-op note  Subjective: The patient is doing well.  No complaints.  Denies N/V  Objective: Vital signs in last 24 hours: Temp:  [97.7 F (36.5 C)-98.1 F (36.7 C)] 97.7 F (36.5 C) (04/11 1155) Pulse Rate:  [56-80] 59 (04/11 1155) Resp:  [12-18] 15 (04/11 1155) BP: (129-187)/(76-98) 133/77 mmHg (04/11 1155) SpO2:  [98 %-100 %] 98 % (04/11 1155) Weight:  [105.688 kg (233 lb)] 105.688 kg (233 lb) (04/11 0554)  Intake/Output from previous day:   Intake/Output this shift: Total I/O In: 3075 [I.V.:2075; IV Piggyback:1000] Out: 730 [Urine:550; Drains:30; Blood:150]  Physical Exam:  General: Alert and oriented. Abdomen: Soft, Nondistended. Incisions: Clean and dry. Urine: amber  Lab Results:  Recent Labs  02/04/15 1028  HGB 13.6  HCT 42.0    Assessment/Plan: POD#0   1) Continue to monitor  2) DVT prophy, clears, IS, amb, pain control   LOS: 0 days   Verdine Grenfell 02/04/2015, 2:30 PM

## 2015-02-04 NOTE — Anesthesia Postprocedure Evaluation (Signed)
  Anesthesia Post-op Note  Patient: Vincent Daugherty  Procedure(s) Performed: Procedure(s) (LRB): ROBOTIC ASSISTED LAPAROSCOPIC RADICAL PROSTATECTOMY LEVEL 1 (N/A)  Patient Location: PACU  Anesthesia Type: General  Level of Consciousness: awake and alert   Airway and Oxygen Therapy: Patient Spontanous Breathing  Post-op Pain: mild  Post-op Assessment: Post-op Vital signs reviewed, Patient's Cardiovascular Status Stable, Respiratory Function Stable, Patent Airway and No signs of Nausea or vomiting  Last Vitals:  Filed Vitals:   02/04/15 1155  BP: 133/77  Pulse: 59  Temp: 36.5 C  Resp: 15    Post-op Vital Signs: stable   Complications: No apparent anesthesia complications \

## 2015-02-04 NOTE — Progress Notes (Signed)
Hgb. 13.6  Hct. 42- results noted.

## 2015-02-04 NOTE — Progress Notes (Signed)
Dr. Marcell Barlow made aware of patient's blood pressures and Aldrete scores- and sleep apnea- O.K. To go to floor

## 2015-02-04 NOTE — Anesthesia Procedure Notes (Signed)
Procedure Name: Intubation Date/Time: 02/04/2015 7:32 AM Performed by: Sherian Maroon A Pre-anesthesia Checklist: Patient identified, Emergency Drugs available, Suction available, Patient being monitored and Timeout performed Patient Re-evaluated:Patient Re-evaluated prior to inductionOxygen Delivery Method: Circle system utilized Preoxygenation: Pre-oxygenation with 100% oxygen Intubation Type: IV induction Ventilation: Mask ventilation without difficulty Grade View: Grade I Tube type: Oral Tube size: 8.0 mm Number of attempts: 1 Airway Equipment and Method: Stylet Placement Confirmation: ETT inserted through vocal cords under direct vision,  positive ETCO2,  CO2 detector and breath sounds checked- equal and bilateral Secured at: 21 cm Tube secured with: Tape Dental Injury: Teeth and Oropharynx as per pre-operative assessment

## 2015-02-04 NOTE — Transfer of Care (Signed)
Immediate Anesthesia Transfer of Care Note  Patient: Vincent Daugherty  Procedure(s) Performed: Procedure(s): ROBOTIC ASSISTED LAPAROSCOPIC RADICAL PROSTATECTOMY LEVEL 1 (N/A)  Patient Location: PACU  Anesthesia Type:General  Level of Consciousness: awake, alert , oriented and patient cooperative  Airway & Oxygen Therapy: Patient Spontanous Breathing and Patient connected to face mask oxygen  Post-op Assessment: Report given to RN and Post -op Vital signs reviewed and stable  Post vital signs: Reviewed and stable  Last Vitals:  Filed Vitals:   02/04/15 0517  BP: 129/83  Pulse: 58  Temp: 36.6 C  Resp: 18    Complications: No apparent anesthesia complications

## 2015-02-04 NOTE — Discharge Instructions (Signed)

## 2015-02-04 NOTE — Progress Notes (Signed)
Hgb. And Hct. Drawn by lab. 

## 2015-02-04 NOTE — Op Note (Signed)
Preoperative diagnosis: Clinically localized adenocarcinoma of the prostate (clinical stage T1c Nx Mx)  Postoperative diagnosis: Clinically localized adenocarcinoma of the prostate (clinical stage T1c Nx Mx)  Procedure:  1. Robotic assisted laparoscopic radical prostatectomy (bilateral nerve sparing)  Surgeon: Roxy Horseman, Brooke Bonito. M.D.  Assistant: Debbrah Alar, PA-C  Anesthesia: General  Complications: None  EBL: 150 mL  IVF:  2000 mL crystalloid  Specimens: 1. Prostate and seminal vesicles  Disposition of specimens: Pathology  Drains: 1. 20 Fr coude catheter 2. # 19 Blake pelvic drain  Indication: Vincent Daugherty is a 59 y.o. year old patient with clinically localized prostate cancer.  After a thorough review of the management options for treatment of prostate cancer, he elected to proceed with surgical therapy and the above procedure(s).  We have discussed the potential benefits and risks of the procedure, side effects of the proposed treatment, the likelihood of the patient achieving the goals of the procedure, and any potential problems that might occur during the procedure or recuperation. Informed consent has been obtained.  Description of procedure:  The patient was taken to the operating room and a general anesthetic was administered. He was given preoperative antibiotics, placed in the dorsal lithotomy position, and prepped and draped in the usual sterile fashion. Next a preoperative timeout was performed. A urethral catheter was placed into the bladder and a site was selected near the umbilicus for placement of the camera port. This was placed using a standard open Hassan technique which allowed entry into the peritoneal cavity under direct vision and without difficulty. A 12 mm port was placed and a pneumoperitoneum established. The camera was then used to inspect the abdomen and there was no evidence of any intra-abdominal injuries or other abnormalities. The remaining  abdominal ports were then placed. 8 mm robotic ports were placed in the right lower quadrant, left lower quadrant, and far left lateral abdominal wall. A 5 mm port was placed in the right upper quadrant and a 12 mm port was placed in the right lateral abdominal wall for laparoscopic assistance. All ports were placed under direct vision without difficulty. The surgical cart was then docked.   Utilizing the cautery scissors, the bladder was reflected posteriorly allowing entry into the space of Retzius and identification of the endopelvic fascia and prostate. The periprostatic fat was then removed from the prostate allowing full exposure of the endopelvic fascia. The endopelvic fascia was then incised from the apex back to the base of the prostate bilaterally and the underlying levator muscle fibers were swept laterally off the prostate thereby isolating the dorsal venous complex. The dorsal vein was then stapled and divided with a 45 mm Flex Echelon stapler. Attention then turned to the bladder neck which was divided anteriorly thereby allowing entry into the bladder and exposure of the urethral catheter. The catheter balloon was deflated and the catheter was brought into the operative field and used to retract the prostate anteriorly. The posterior bladder neck was then examined and was divided allowing further dissection between the bladder and prostate posteriorly until the vasa deferentia and seminal vessels were identified. The vasa deferentia were isolated, divided, and lifted anteriorly. The seminal vesicles were dissected down to their tips with care to control the seminal vascular arterial blood supply. These structures were then lifted anteriorly and the space between Denonvillier's fascia and the anterior rectum was developed with a combination of sharp and blunt dissection. This isolated the vascular pedicles of the prostate.  The lateral prostatic  fascia was then sharply incised allowing release of  the neurovascular bundles bilaterally. The vascular pedicles of the prostate were then ligated with Weck clips between the prostate and neurovascular bundles and divided with sharp cold scissor dissection resulting in neurovascular bundle preservation. The neurovascular bundles were then separated off the apex of the prostate and urethra bilaterally.  The urethra was then sharply transected allowing the prostate specimen to be disarticulated. The pelvis was copiously irrigated and hemostasis was ensured. There was no evidence for rectal injury.  Attention then turned to the urethral anastomosis. A 2-0 Vicryl slip knot was placed between Denonvillier's fascia, the posterior bladder neck, and the posterior urethra to reapproximate these structures. A double-armed 3-0 Monocryl suture was then used to perform a 360 running tension-free anastomosis between the bladder neck and urethra. A new urethral catheter was then placed into the bladder and irrigated. There were no blood clots within the bladder and the anastomosis appeared to be watertight. A #19 Blake drain was then brought through the left lateral 8 mm port site and positioned appropriately within the pelvis. It was secured to the skin with a nylon suture. The surgical cart was then undocked. The right lateral 12 mm port site was closed at the fascial level with a 0 Vicryl suture placed laparoscopically. All remaining ports were then removed under direct vision. The prostate specimen was removed intact within the Endopouch retrieval bag via the periumbilical camera port site. This fascial opening was closed with two running 0 Vicryl sutures. 0.25% Marcaine was then injected into all port sites and all incisions were reapproximated at the skin level with 4-0 Monocryl subcuticular sutures and Dermabond. The patient appeared to tolerate the procedure well and without complications. The patient was able to be extubated and transferred to the recovery unit in  satisfactory condition.  Pryor Curia MD

## 2015-02-05 ENCOUNTER — Encounter (HOSPITAL_COMMUNITY): Payer: Self-pay | Admitting: Urology

## 2015-02-05 ENCOUNTER — Encounter: Payer: BLUE CROSS/BLUE SHIELD | Admitting: Medical Oncology

## 2015-02-05 LAB — HEMOGLOBIN AND HEMATOCRIT, BLOOD
HCT: 38.3 % — ABNORMAL LOW (ref 39.0–52.0)
HEMOGLOBIN: 12.6 g/dL — AB (ref 13.0–17.0)

## 2015-02-05 MED ORDER — BISACODYL 10 MG RE SUPP
10.0000 mg | Freq: Once | RECTAL | Status: AC
  Administered 2015-02-05: 10 mg via RECTAL
  Filled 2015-02-05: qty 1

## 2015-02-05 MED ORDER — HYDROCODONE-ACETAMINOPHEN 5-325 MG PO TABS: 1.0000 | ORAL_TABLET | Freq: Four times a day (QID) | ORAL | Status: DC | PRN

## 2015-02-05 NOTE — [facility] Oncology Navigator Note (Signed)
I went to see Vincent Daugherty yesterday after his robotic prostatectomy. He was sleeping so I did not disturb him. This morning I visited with patient and his wife. He states he is feeling very well this morning except for a sore abdomen. He and his wife are so happy he had the surgery and looking forward to the future. I asked them to call me with any questions or concerns. They voiced understanding and thanked me for taking the time to come by.

## 2015-02-05 NOTE — Discharge Summary (Signed)
  Date of admission: 02/04/2015  Date of discharge: 02/05/2015  Admission diagnosis: Prostate Cancer  Discharge diagnosis: Prostate Cancer  History and Physical: For full details, please see admission history and physical. Briefly, Vincent Daugherty is a 59 y.o. gentleman with localized prostate cancer.  After discussing management/treatment options, he elected to proceed with surgical treatment.  Hospital Course: Vincent Daugherty was taken to the operating room on 02/04/2015 and underwent a robotic assisted laparoscopic radical prostatectomy. He tolerated this procedure well and without complications. Postoperatively, he was able to be transferred to a regular hospital room following recovery from anesthesia.  He was able to begin ambulating the night of surgery. He remained hemodynamically stable overnight.  He had excellent urine output with appropriately minimal output from his pelvic drain and his pelvic drain was removed on POD #1.  He was transitioned to oral pain medication, tolerated a clear liquid diet, and had met all discharge criteria and was able to be discharged home later on POD#1.  Laboratory values:  Recent Labs  02/04/15 1028 02/05/15 0457  HGB 13.6 12.6*  HCT 42.0 38.3*    Disposition: Home  Discharge instruction: He was instructed to be ambulatory but to refrain from heavy lifting, strenuous activity, or driving. He was instructed on urethral catheter care.  Discharge medications:     Medication List    STOP taking these medications        acetaminophen 650 MG CR tablet  Commonly known as:  TYLENOL     aspirin-acetaminophen-caffeine 250-250-65 MG per tablet  Commonly known as:  EXCEDRIN MIGRAINE     COMPLETE PROTEIN/VITAMIN SHAKE PO     naproxen sodium 220 MG tablet  Commonly known as:  ANAPROX     PRESCRIPTION MEDICATION     sildenafil 100 MG tablet  Commonly known as:  VIAGRA      TAKE these medications        ciprofloxacin 500 MG tablet  Commonly  known as:  CIPRO  Take 1 tablet (500 mg total) by mouth 2 (two) times daily. Start day prior to office visit for foley removal     HYDROcodone-acetaminophen 5-325 MG per tablet  Commonly known as:  NORCO  Take 1-2 tablets by mouth every 6 (six) hours as needed.     loratadine 10 MG tablet  Commonly known as:  CLARITIN  Take 10 mg by mouth every morning.     oxymetazoline 0.05 % nasal spray  Commonly known as:  AFRIN  Place 1 spray into both nostrils 2 (two) times daily as needed for congestion (Vicks nasal decongestant).     simvastatin 40 MG tablet  Commonly known as:  ZOCOR  Take 1 tablet by mouth at bedtime.     SUMAtriptan 100 MG tablet  Commonly known as:  IMITREX  Take 1 tablet by mouth as needed for migraine.     triamcinolone cream 0.5 %  Commonly known as:  KENALOG  Apply 1 application topically every morning.        Followup: He will followup in 1 week for catheter removal and to discuss his surgical pathology results.

## 2015-02-05 NOTE — Progress Notes (Addendum)
Patient ID: Vincent Daugherty, male   DOB: 03/11/1956, 59 y.o.   MRN: [id number]  1 Day Post-Op Subjective: The patient is doing well.  Some nausea overnight now improved. Pain is adequately controlled.  Objective: Vital signs in last 24 hours: Temp:  [97.7 F (36.5 C)-98.7 F (37.1 C)] 98.7 F (37.1 C) (04/12 0615) Pulse Rate:  [55-80] 59 (04/12 0615) Resp:  [12-20] 18 (04/12 0615) BP: (119-187)/(66-98) 137/66 mmHg (04/12 0615) SpO2:  [97 %-100 %] 100 % (04/12 0615)  Intake/Output from previous day: 04/11 0701 - 04/12 0700 In: 6340 [P.O.:240; I.V.:5000; IV Piggyback:1100] Out: 3428 [Urine:3825; Drains:85; Blood:150] Intake/Output this shift:    Physical Exam:  General: Alert and oriented. CV: RRR Lungs: Clear bilaterally. GI: Soft, Nondistended. Incisions: Clean, dry, and intact Urine: Clear Extremities: Nontender, no erythema, no edema.  Lab Results:  Recent Labs  02/04/15 1028 02/05/15 0457  HGB 13.6 12.6*  HCT 42.0 38.3*      Assessment/Plan: POD# 1 s/p robotic prostatectomy.  1) SL IVF 2) Ambulate, Incentive spirometry 3) Transition to oral pain medication 4) Dulcolax suppository 5) D/C pelvic drain 6) Plan for likely discharge later today   Pryor Curia. MD   LOS: 1 day   Rozetta Stumpp,LES 02/05/2015, 7:53 AM

## 2015-05-16 ENCOUNTER — Telehealth: Payer: Self-pay | Admitting: Medical Oncology

## 2015-05-16 NOTE — Telephone Encounter (Signed)
Oncology Nurse Navigator Documentation  Oncology Nurse Navigator Flowsheets 05/16/2015  Navigator Encounter Type 3 month- Left a message requesting Vincent Daugherty to return my call. This is a follow up call to see how he is doing 3 months post prostatectomy.

## 2015-05-20 ENCOUNTER — Telehealth: Payer: Self-pay | Admitting: Medical Oncology

## 2015-05-23 ENCOUNTER — Telehealth: Payer: Self-pay | Admitting: Medical Oncology

## 2015-05-23 NOTE — Telephone Encounter (Signed)
Oncology Nurse Navigator Documentation  Oncology Nurse Navigator Flowsheets 05/16/2015 05/23/2015  Navigator Encounter Type 3 month Telephone;3 month- left message for Mr. Vincent Daugherty to call me. Following up 3 months post prostatectomy.  Time Spent with Patient - (No Data)

## 2015-05-23 NOTE — Telephone Encounter (Signed)
Oncology Nurse Navigator Documentation  Oncology Nurse Navigator Flowsheets 05/16/2015 05/23/2015 05/23/2015  Navigator Encounter Type 3 month Telephone;3 month Telephone;3 month- Mr. Ohms states he is doing well. He has completed his PT and how doing PT at home. He feels great and is optimistic. He did have infection in one of the surgical sites but he took antibiotics and it healed well. No complaints. He will follow up with Dr. Alinda Money 10/26 with a PSA.   Time Spent with Patient - (No Data) 15

## 2015-06-20 ENCOUNTER — Ambulatory Visit (INDEPENDENT_AMBULATORY_CARE_PROVIDER_SITE_OTHER): Payer: BLUE CROSS/BLUE SHIELD | Admitting: Neurology

## 2015-06-20 ENCOUNTER — Encounter: Payer: Self-pay | Admitting: Neurology

## 2015-06-20 VITALS — BP 136/64 | HR 70 | Resp 18 | Ht 74.5 in | Wt 230.0 lb

## 2015-06-20 DIAGNOSIS — Z9989 Dependence on other enabling machines and devices: Principal | ICD-10-CM

## 2015-06-20 DIAGNOSIS — Z9889 Other specified postprocedural states: Secondary | ICD-10-CM | POA: Diagnosis not present

## 2015-06-20 DIAGNOSIS — G4733 Obstructive sleep apnea (adult) (pediatric): Secondary | ICD-10-CM | POA: Diagnosis not present

## 2015-06-20 DIAGNOSIS — Z9079 Acquired absence of other genital organ(s): Secondary | ICD-10-CM

## 2015-06-20 NOTE — Patient Instructions (Signed)

## 2015-06-20 NOTE — Progress Notes (Signed)
Subjective:    Patient ID: Vincent Daugherty is a 59 y.o. male.  HPI     Interim history:   Mr. Vincent Daugherty is a very pleasant 59 year old right-handed gentleman with an underlying medical history of allergic rhinitis, diverticulosis, arthritis, status post left total hip replacement surgery in December 2011, hyperlipidemia, migraines, erectile dysfunction, and prior diagnosis of obstructive sleep apnea, who presents for followup consultation of his obstructive sleep apnea. He is unaccompanied today. I last saw him on 06/19/2014, at which time he reported that his nocturia had improved. Sometimes he was falling asleep before putting the mask on. He had some sleep maintenance issues. Overall however, he felt that he was doing rather well. His sleep was better and his snoring has improved.   Today, 06/20/2015: I reviewed his CPAP compliance data from 05/19/2015 through 06/17/2015 which is a total of 30 days during which time he used his machine every night with percent used days greater than 4 hours at 97%, indicating excellent compliance with an average usage of 6 hours and 9 minutes, residual AHI acceptable at 3 per hour, leak low with the 95th percentile at 18 L/m on a pressure of 9 cm with EPR of 2.  Today, 06/20/2015: He reports doing well. He is compliant with CPAP treatment. He's feeling well. Unfortunately, in the interim, he was diagnosed with prostate cancer and had prostatectomy on 02/04/2015 successfully. He had an interim infection which was treated with anti-biotics. He has no residual problems and needed no chemotherapy or radiation. He had lost some weight around his cancer diagnosis but then had some weight gain.   Previously:  I saw him on 01/22/2014, at which time he reported feeling a little better overall. He reported that his wife noted no more snoring, his nocturia was improved and he reported less morning headaches. He felt less tired during the day.he felt his sleep was more  consolidated. Overall, he felt his CPAP experience was much better than in the past. Based on his compliance data I suggested we change him from AutoPap to set pressure of 9 cm at the time of his last visit.   I reviewed the patient's CPAP compliance data from 01/06/2014 to 02/04/2014, which is a total of 30 days, during which time the patient used CPAP every day. The average usage for all days was 6 hours and 19 minutes. The percent used days greater than 4 hours was 97 %, indicating excellent compliance. The residual AHI was 2.3 per hour, indicating an adequate treatment pressure of 9 cwp with EPR of 2. Air leak from the mask was low at 9.1 L per minute at the 95th percentile.  I reviewed his compliance data from 05/21/2014 through 06/18/2014 which is a total of 29 days during which time he used his machine every night. Percent used days greater than 4 hours was above 85%, residual AHI low at 2.7, leak low at 11.2, pressure at 9 with EPR of 2, average usage of over 5 hours and 20 minutes.  I first met him on 08/31/2013, at which time he reported occasional morning headaches, and nighttime sleep disruption. I asked him to return for sleep study. He had a baseline sleep study on 09/09/2013 and I went over his test results with him in detail today. His sleep efficiency was reduced at 81%. Latency to sleep was 4 minutes and wake after sleep onset was 78 minutes with severe sleep fragmentation noted. He had an elevated arousal index of 40.7 per hour.  His sleep architecture showed an increased percentage of stage I and stage II sleep, a normal percentage of deep sleep and a decreased percentage of REM sleep at 11.4% with a mildly prolonged REM latency. He had no significant periodic leg movements of sleep and no significant EKG changes. Mild to moderate snoring was noted. He had a total AHI of 11.7 per hour, rising to 37.5 per hour in REM sleep and 14.6 per hour in the supine position. Baseline oxygen saturation  was 95%, nadir was 86%. Based on the test results I suggested he try AutoPap nonprescribed and AutoPap machine for him. I reviewed compliance data from 12/06/2013 through 01/04/2014 which is a total of 30 days during which time he uses CPAP every night except for one night. Percent used days greater than 4 hours was 87%, indicating very good compliance. Average usage was 6 hours and 2 minutes. Residual AHI was 2.3 per hour. Leak was low. 95th percentile pressure was 9.1 cm. Maximum pressure was 10.3 cm, median pressure was 7.5 cm, range from 6-16 cm with EPR of 2.   I reviewed his compliance data from 12/22/2013 to 01/20/2014 which is 30 days, during which time he used CPAP every night. Percent used days greater than 4 hours was 93%, indicating excellent compliance. Average usage was 5 hours and 53 minutes, AHI was 2.4 and leak was low at 7.9 L per minute at the 95th percentile. He was on AutoPap and 95th percentile pressure was 8.8 cm.   He had previously been treated with CPAP, but stopped using it. I reviewed his sleep study report from 10/23/2006 last time: His total AHI was 4.9 per hour rising to 6.1 per hour. He had an increased percentage of stage I and stage II sleep, a normal REM latency and mildly decrease of REM percentage, and absence of deep sleep. According to the sleep study report he had been diagnosed with severe obstructive sleep apnea before, based on an AHI of 30 per hour in June 2005 as well as a CPAP titration study in July 2005 during which time his pressure was 7 cm of water pressure. He used it for about 1-2 months, but did not like the noise of the machine and the mask was uncomfortable.   His Past Medical History Is Significant For: Past Medical History  Diagnosis Date  . Other and unspecified hyperlipidemia   . Migraine, unspecified, without mention of intractable migraine without mention of status migrainosus   . Obstructive sleep apnea (adult) (pediatric)   . Nocturia   .  Impotence of organic origin   . Personal history of other diseases of digestive system   . Need for prophylactic vaccination and inoculation against influenza   . OSA (obstructive sleep apnea) 08/31/2013  . Prostate cancer   . Complication of anesthesia     nausea during recovery    His Past Surgical History Is Significant For: Past Surgical History  Procedure Laterality Date  . Sp thromboect prim mech add - sep      anterior approach  . Prostate biopsy    . Appendectomy    . Total hip arthroplasty      anterior approach, left hip  . Robot assisted laparoscopic radical prostatectomy N/A 02/04/2015    Procedure: ROBOTIC ASSISTED LAPAROSCOPIC RADICAL PROSTATECTOMY LEVEL 1;  Surgeon: Raynelle Bring, MD;  Location: [facility];  Service: Urology;  Laterality: N/A;    His Family History Is Significant For: Family History  Problem Relation Age of Onset  .  Ulcers Father   . Hypertension Mother   . CAD Mother   . Stroke Mother     His Social History Is Significant For: Social History   Social History  . Marital Status: Married    Spouse Name: phillis  . Number of Children: 3  . Years of Education: college   Occupational History  . DIRECTOR    Social History Main Topics  . Smoking status: Former Smoker -- 0.50 packs/day for 1 years    Types: Cigarettes    Quit date: 10/26/1981  . Smokeless tobacco: Never Used  . Alcohol Use: No  . Drug Use: No  . Sexual Activity: Yes   Other Topics Concern  . None   Social History Narrative    His Allergies Are:  Allergies  Allergen Reactions  . Ampicillin Diarrhea  :   His Current Medications Are:  Outpatient Encounter Prescriptions as of 06/20/2015  Medication Sig  . simvastatin (ZOCOR) 40 MG tablet Take 1 tablet by mouth at bedtime.   . SUMAtriptan (IMITREX) 100 MG tablet Take 1 tablet by mouth as needed for migraine.   . triamcinolone cream (KENALOG) 0.5 % Apply 1 application topically every morning.   . [DISCONTINUED]  ciprofloxacin (CIPRO) 500 MG tablet Take 1 tablet (500 mg total) by mouth 2 (two) times daily. Start day prior to office visit for foley removal  . [DISCONTINUED] HYDROcodone-acetaminophen (NORCO) 5-325 MG per tablet Take 1-2 tablets by mouth every 6 (six) hours as needed.  . [DISCONTINUED] loratadine (CLARITIN) 10 MG tablet Take 10 mg by mouth every morning.  . [DISCONTINUED] oxymetazoline (AFRIN) 0.05 % nasal spray Place 1 spray into both nostrils 2 (two) times daily as needed for congestion (Vicks nasal decongestant).   No facility-administered encounter medications on file as of 06/20/2015.   Review of Systems:  Out of a complete 14 point review of systems, all are reviewed and negative with the exception of these symptoms as listed below:   Review of Systems  Neurological:       Patient states that he is doing well with CPAP, no complaints.   All other systems reviewed and are negative.   Objective:  Neurologic Exam  Physical Exam Physical Examination:   Filed Vitals:   06/20/15 1525  BP: 136/64  Pulse: 70  Resp: 18    General Examination: The patient is a very pleasant 59 y.o. male in no acute distress. He appears well-developed and well-nourished and very well groomed. He is in good spirits today.  HEENT: Normocephalic, atraumatic, pupils are equal, round and reactive to light and accommodation. He may have the beginning of mild cataracts bilaterally Extraocular tracking is good without limitation to gaze excursion or nystagmus noted. Normal smooth pursuit is noted. Hearing is grossly intact. Face is symmetric with normal facial animation and normal facial sensation. Speech is clear with no dysarthria noted. There is no hypophonia. There is no lip, neck/head, jaw or voice tremor. Neck is supple with full range of passive and active motion. There are no carotid bruits on auscultation. Oropharynx exam reveals: mild mouth dryness, dentures in place. There is a moderately airway  crowding, due to large tongue and floppy soft palate; uvula and tonsils are actually small. Mallampati is class II. Tongue protrudes centrally and palate elevates symmetrically. Tonsils are 1+.    Chest: Clear to auscultation without wheezing, rhonchi or crackles noted.  Heart: S1+S2+0, regular and normal without murmurs, rubs or gallops noted.   Abdomen: Soft, non-tender and non-distended  with normal bowel sounds appreciated on auscultation.  Extremities: There is no pitting edema in the distal lower extremities bilaterally. Pedal pulses are intact.  Skin: Warm and dry without trophic changes noted. There are no varicose veins.  Musculoskeletal: exam reveals no obvious joint deformities, tenderness or joint swelling or erythema.   Neurologically:  Mental status: The patient is awake, alert and oriented in all 4 spheres. His memory, attention, language and knowledge are appropriate. There is no aphasia, agnosia, apraxia or anomia. Speech is clear with normal prosody and enunciation. Thought process is linear. Mood is congruent and affect is normal.  Cranial nerves are as described above under HEENT exam. In addition, shoulder shrug is normal with equal shoulder height noted. Motor exam: Normal bulk, strength and tone is noted. There is no drift, tremor or rebound. Romberg is negative. Reflexes are 2+ throughout. Fine motor skills are intact with normal finger taps, normal hand movements, normal rapid alternating patting, normal foot taps and normal foot agility.  Cerebellar testing shows no dysmetria or intention tremor on finger to nose testing. There is no truncal or gait ataxia.  Sensory exam is intact to light touch in the upper and lower extremities.  Gait, station and balance are unremarkable. No veering to one side is noted. No leaning to one side is noted. Posture is age-appropriate and stance is narrow based. No problems turning are noted. He turns en bloc. Tandem walk is unremarkable.         Assessment and Plan:   In summary, NIKE SOUTHWELL is a very pleasant 59 year old male with an underlying medical history of allergic rhinitis, diverticulosis, arthritis, status post left total hip replacement surgery in December 2011, hyperlipidemia, migraines, erectile dysfunction, recent diagnosis of prostate cancer and status post prostatectomy, who presents for follow-up consultation of his obstructive sleep apnea. He had a sleep study in November 2015 which showed mild to moderate obstructive sleep apnea and I put him on AutoPap trial. He did well with that and since March 2015 he has been on a set pressure of 9 cm with full compliance and good results. His exam is stable. He is doing well. We went over his compliance data and his previous sleep study again today.  I encouraged him to continue using CPAP regularly to help reduce his cardiovascular risk long term and ongoing good results with his sleep. He had endorsed improvement in his nocturia as well. I am rather pleased with how he is doing. I would like to see him back on a yearly basis. I answered all his questions today and he was in agreement.  I spent 15 minutes in total face-to-face time with the patient, more than 50% of which was spent in counseling and coordination of care, reviewing test results, reviewing medication and discussing or reviewing the diagnosis of OSA, its prognosis and treatment options.

## 2015-08-13 ENCOUNTER — Telehealth: Payer: Self-pay | Admitting: Medical Oncology

## 2015-08-13 NOTE — Telephone Encounter (Signed)
Oncology Nurse Navigator Documentation  Oncology Nurse Navigator Flowsheets 05/23/2015 05/23/2015 08/13/2015  Navigator Encounter Type Telephone;3 month Telephone;3 month Telephone;6 month- Left patient a message requesting a call back regarding 6 month prostatectomy  Time Spent with Patient (No Data) 15 -

## 2015-09-22 IMAGING — CR DG CHEST 2V
2 series · 2 of 2 positions shown · non-contrast
Comparison: None.

CLINICAL DATA: Prostatectomy.

EXAM:
CHEST  2 VIEW

[w chest pa]
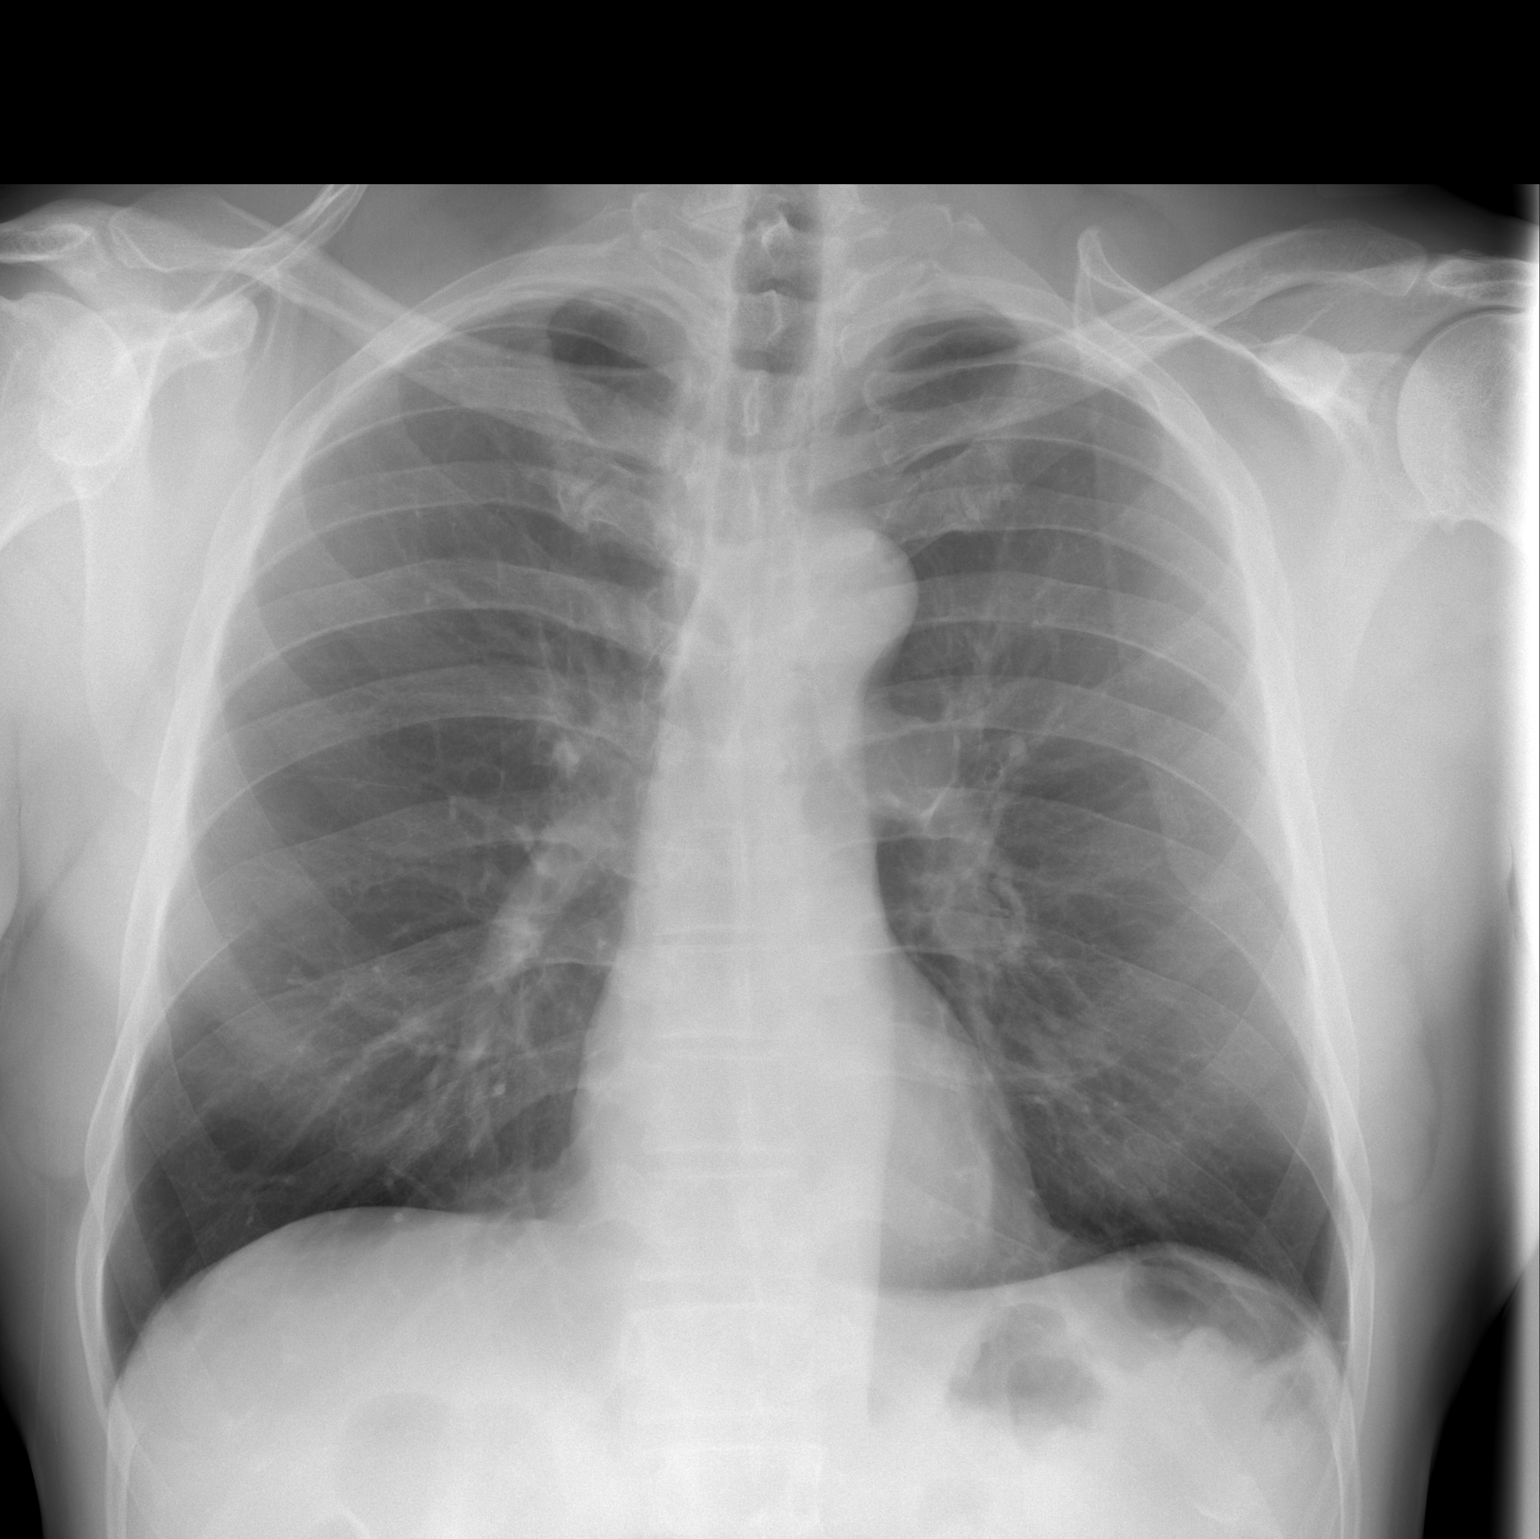

[w chest lat]
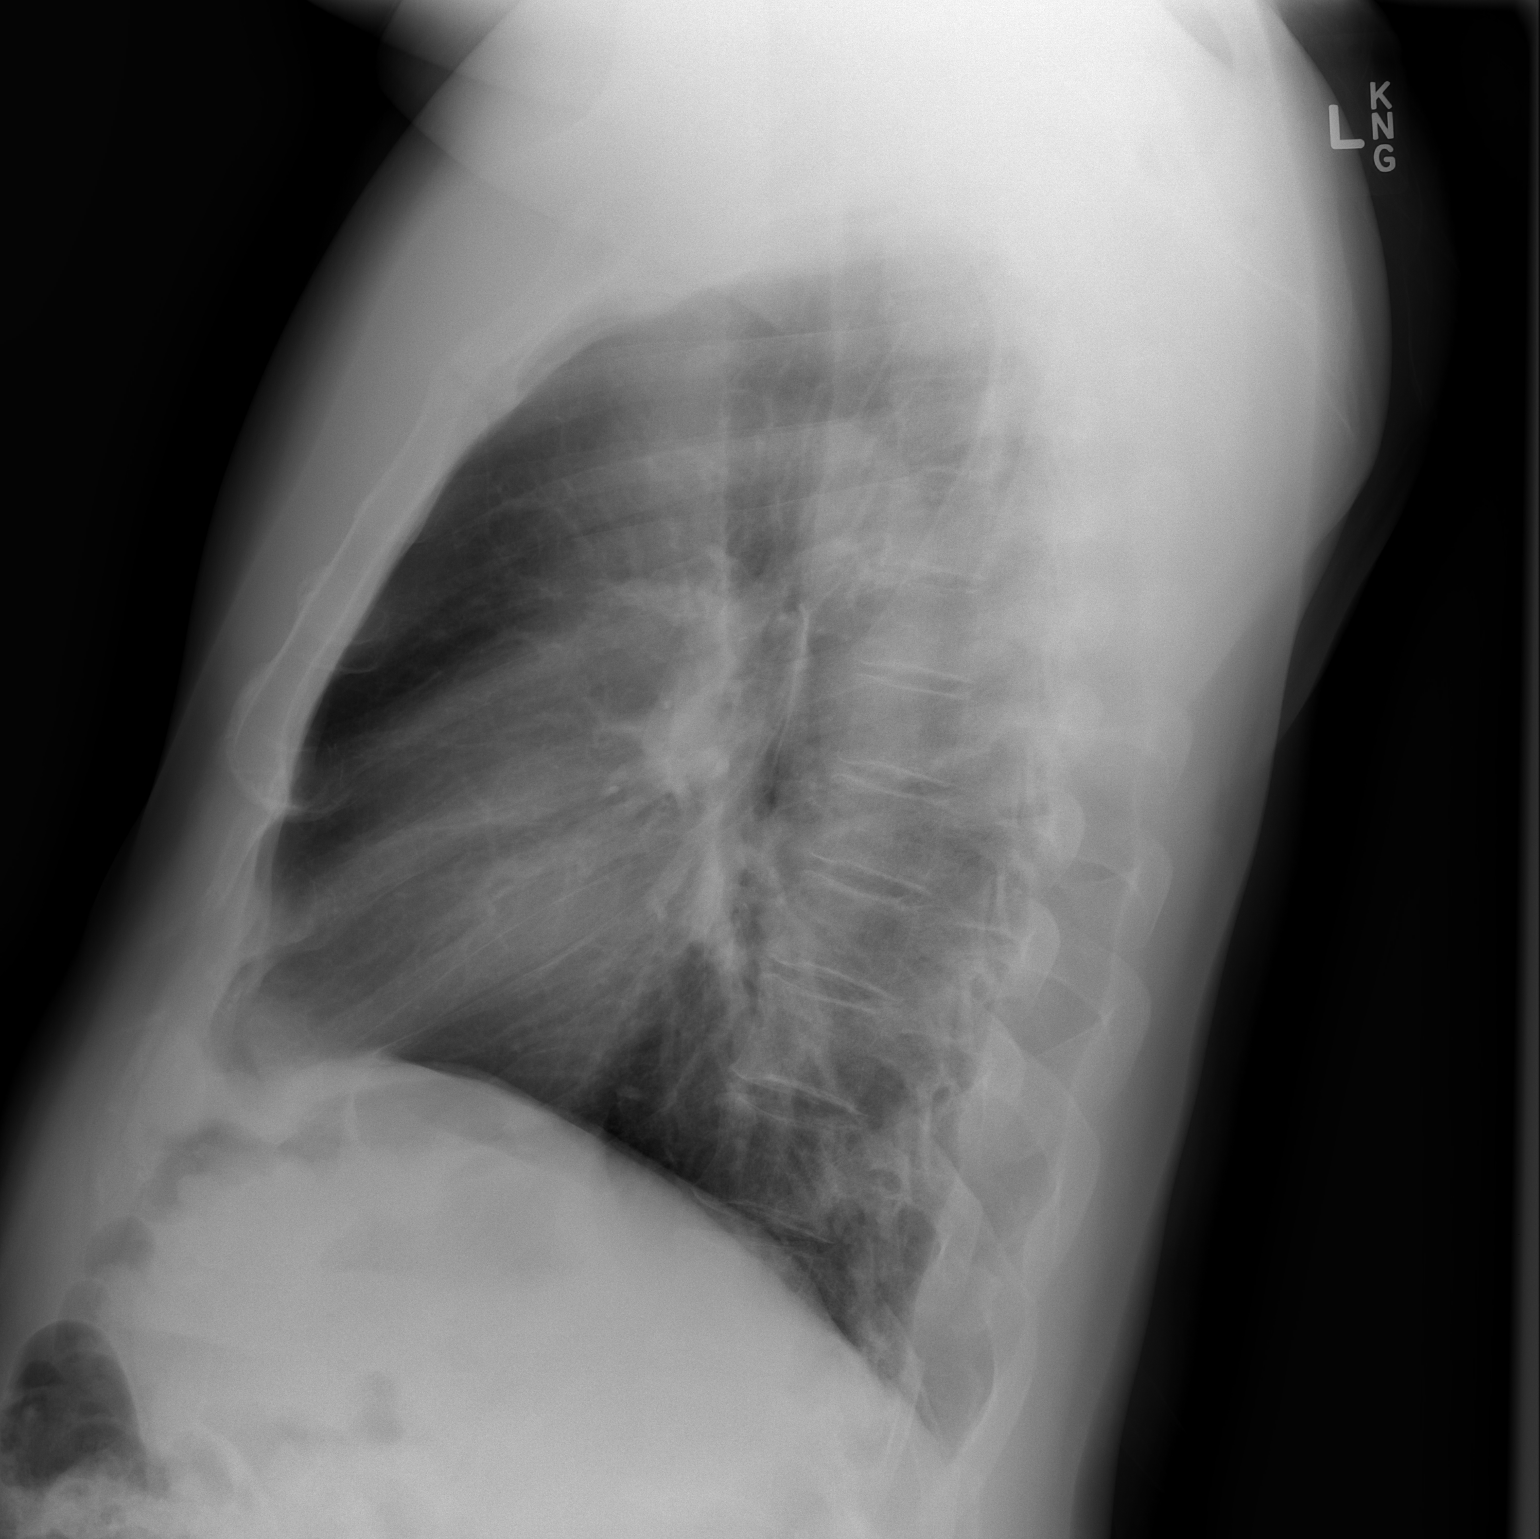

[2 of 2 positions shown; findings below may reference images not displayed]

FINDINGS: The heart size and mediastinal contours are within normal limits.
Both lungs are clear. The visualized skeletal structures are
unremarkable.
IMPRESSION: No active cardiopulmonary disease.

## 2015-10-04 ENCOUNTER — Other Ambulatory Visit: Payer: Self-pay | Admitting: Gastroenterology

## 2016-01-19 ENCOUNTER — Encounter: Payer: Self-pay | Admitting: *Deleted

## 2016-03-04 DIAGNOSIS — C61 Malignant neoplasm of prostate: Secondary | ICD-10-CM | POA: Diagnosis not present

## 2016-03-11 DIAGNOSIS — Z0000 Encounter for general adult medical examination without abnormal findings: Secondary | ICD-10-CM | POA: Diagnosis not present

## 2016-03-11 DIAGNOSIS — C61 Malignant neoplasm of prostate: Secondary | ICD-10-CM | POA: Diagnosis not present

## 2016-03-11 DIAGNOSIS — N5201 Erectile dysfunction due to arterial insufficiency: Secondary | ICD-10-CM | POA: Diagnosis not present

## 2016-03-20 DIAGNOSIS — N5201 Erectile dysfunction due to arterial insufficiency: Secondary | ICD-10-CM | POA: Diagnosis not present

## 2016-05-11 DIAGNOSIS — H5213 Myopia, bilateral: Secondary | ICD-10-CM | POA: Diagnosis not present

## 2016-05-11 DIAGNOSIS — H10402 Unspecified chronic conjunctivitis, left eye: Secondary | ICD-10-CM | POA: Diagnosis not present

## 2016-06-18 ENCOUNTER — Telehealth: Payer: Self-pay

## 2016-06-18 NOTE — Telephone Encounter (Signed)
LM for patient to bring in AutoPAP machine or SD card to appt on Monday.

## 2016-06-22 ENCOUNTER — Encounter: Payer: Self-pay | Admitting: Neurology

## 2016-06-22 ENCOUNTER — Ambulatory Visit (INDEPENDENT_AMBULATORY_CARE_PROVIDER_SITE_OTHER): Payer: BLUE CROSS/BLUE SHIELD | Admitting: Neurology

## 2016-06-22 VITALS — BP 123/75 | HR 52 | Ht 74.5 in | Wt 226.0 lb

## 2016-06-22 DIAGNOSIS — G4733 Obstructive sleep apnea (adult) (pediatric): Secondary | ICD-10-CM

## 2016-06-22 DIAGNOSIS — Z9989 Dependence on other enabling machines and devices: Principal | ICD-10-CM

## 2016-06-22 NOTE — Patient Instructions (Signed)
Keep up the good work! I will see you back in one year for sleep apneas.

## 2016-06-22 NOTE — Progress Notes (Signed)
Subjective:    Patient ID: Vincent Daugherty is a 60 y.o. male.  HPI     Interim history:   Vincent Daugherty is a very pleasant 60 year old right-handed gentleman with an underlying medical history of allergic rhinitis, diverticulosis, arthritis, status post left total hip replacement surgery in December 2011, hyperlipidemia, migraines, erectile dysfunction, and prior diagnosis of obstructive sleep apnea, who presents for followup consultation of his obstructive sleep apnea, on CPAP. He is unaccompanied today. I last saw him on 06/20/2015, at which time he was doing well and fully compliant with CPAP. He was unfortunately diagnosed with prostate cancer in the interim and had a prostatectomy on 02/04/2015, with a subsequent infection which was treated with antibiotics. He had thankfully no residual problems and needed no chemotherapy or radiation.   Today, 06/22/2016: I reviewed his CPAP compliance data from 05/23/16 through 06/21/16, which is a total of 30 days, during which time he used his machine 27 days, with percent used days greater than 4 hours at 50%, indicating suboptimal compliance with an average usage of 3 hours and 47 minutes, AHI 1.4 per hour, leak acceptable with the 95th percentile at 18 L/m on a pressure of 9 cm with EPR of 2.  Today, 06/22/2016: He reports that he has had a lapse in his supplies. He says, he has a hole in the hose and has not received updated supplies. We will get in touch with his DME. Otherwise, medically, he is stable. Had his yearly check up with Dr. Brigitte Pulse and also prostate numbers looked well. No evidence of recurrence of cancer, thankfully.     Previously:   I saw him on 06/19/2014, at which time he reported that his nocturia had improved. Sometimes he was falling asleep before putting the mask on. He had some sleep maintenance issues. Overall however, he felt that he was doing rather well. His sleep was better and his snoring has improved.    I reviewed his CPAP  compliance data from 05/19/2015 through 06/17/2015 which is a total of 30 days during which time he used his machine every night with percent used days greater than 4 hours at 97%, indicating excellent compliance with an average usage of 6 hours and 9 minutes, residual AHI acceptable at 3 per hour, leak low with the 95th percentile at 18 L/m on a pressure of 9 cm with EPR of 2.   I saw him on 01/22/2014, at which time he reported feeling a little better overall. He reported that his wife noted no more snoring, his nocturia was improved and he reported less morning headaches. He felt less tired during the day.he felt his sleep was more consolidated. Overall, he felt his CPAP experience was much better than in the past. Based on his compliance data I suggested we change him from AutoPap to set pressure of 9 cm at the time of his last visit.   I reviewed the patient's CPAP compliance data from 01/06/2014 to 02/04/2014, which is a total of 30 days, during which time the patient used CPAP every day. The average usage for all days was 6 hours and 19 minutes. The percent used days greater than 4 hours was 97 %, indicating excellent compliance. The residual AHI was 2.3 per hour, indicating an adequate treatment pressure of 9 cwp with EPR of 2. Air leak from the mask was low at 9.1 L per minute at the 95th percentile.   I reviewed his compliance data from 05/21/2014 through 06/18/2014 which is  a total of 29 days during which time he used his machine every night. Percent used days greater than 4 hours was above 85%, residual AHI low at 2.7, leak low at 11.2, pressure at 9 with EPR of 2, average usage of over 5 hours and 20 minutes.   I first met him on 08/31/2013, at which time he reported occasional morning headaches, and nighttime sleep disruption. I asked him to return for sleep study. He had a baseline sleep study on 09/09/2013 and I went over his test results with him in detail today. His sleep efficiency was  reduced at 81%. Latency to sleep was 4 minutes and wake after sleep onset was 78 minutes with severe sleep fragmentation noted. He had an elevated arousal index of 40.7 per hour. His sleep architecture showed an increased percentage of stage I and stage II sleep, a normal percentage of deep sleep and a decreased percentage of REM sleep at 11.4% with a mildly prolonged REM latency. He had no significant periodic leg movements of sleep and no significant EKG changes. Mild to moderate snoring was noted. He had a total AHI of 11.7 per hour, rising to 37.5 per hour in REM sleep and 14.6 per hour in the supine position. Baseline oxygen saturation was 95%, nadir was 86%. Based on the test results I suggested he try AutoPap nonprescribed and AutoPap machine for him. I reviewed compliance data from 12/06/2013 through 01/04/2014 which is a total of 30 days during which time he uses CPAP every night except for one night. Percent used days greater than 4 hours was 87%, indicating very good compliance. Average usage was 6 hours and 2 minutes. Residual AHI was 2.3 per hour. Leak was low. 95th percentile pressure was 9.1 cm. Maximum pressure was 10.3 cm, median pressure was 7.5 cm, range from 6-16 cm with EPR of 2.   I reviewed his compliance data from 12/22/2013 to 01/20/2014 which is 30 days, during which time he used CPAP every night. Percent used days greater than 4 hours was 93%, indicating excellent compliance. Average usage was 5 hours and 53 minutes, AHI was 2.4 and leak was low at 7.9 L per minute at the 95th percentile. He was on AutoPap and 95th percentile pressure was 8.8 cm.   He had previously been treated with CPAP, but stopped using it. I reviewed his sleep study report from 10/23/2006 last time: His total AHI was 4.9 per hour rising to 6.1 per hour. He had an increased percentage of stage I and stage II sleep, a normal REM latency and mildly decrease of REM percentage, and absence of deep sleep. According to the  sleep study report he had been diagnosed with severe obstructive sleep apnea before, based on an AHI of 30 per hour in June 2005 as well as a CPAP titration study in July 2005 during which time his pressure was 7 cm of water pressure. He used it for about 1-2 months, but did not like the noise of the machine and the mask was uncomfortable.    His Past Medical History Is Significant For: Past Medical History:  Diagnosis Date  . Complication of anesthesia    nausea during recovery  . Impotence of organic origin   . Migraine, unspecified, without mention of intractable migraine without mention of status migrainosus   . Need for prophylactic vaccination and inoculation against influenza   . Nocturia   . Obstructive sleep apnea (adult) (pediatric)   . OSA (obstructive sleep apnea) 08/31/2013  .  Other and unspecified hyperlipidemia   . Personal history of other diseases of digestive system   . Prostate cancer [facility])     His Past Surgical History Is Significant For: Past Surgical History:  Procedure Laterality Date  . APPENDECTOMY    . PROSTATE BIOPSY    . ROBOT ASSISTED LAPAROSCOPIC RADICAL PROSTATECTOMY N/A 02/04/2015   Procedure: ROBOTIC ASSISTED LAPAROSCOPIC RADICAL PROSTATECTOMY LEVEL 1;  Surgeon: Vincent Purpura, MD;  Location: [facility];  Service: Urology;  Laterality: N/A;  . SP THROMBOECT PRIM MECH ADD - SEP     anterior approach  . TOTAL HIP ARTHROPLASTY     anterior approach, left hip    His Family History Is Significant For: Family History  Problem Relation Age of Onset  . Ulcers Father   . Hypertension Mother   . CAD Mother   . Stroke Mother     His Social History Is Significant For: Social History   Social History  . Marital status: Married    Spouse name: phillis  . Number of children: 3  . Years of education: college   Occupational History  . DIRECTOR [employer]   Social History Main Topics  . Smoking status: Former Smoker    Packs/day: 0.50    Years:  1.00    Types: Cigarettes    Quit date: 10/26/1981  . Smokeless tobacco: Never Used  . Alcohol use No  . Drug use: No  . Sexual activity: Yes   Other Topics Concern  . None   Social History Narrative  . None    His Allergies Are:  Allergies  Allergen Reactions  . Ampicillin Diarrhea  :   His Current Medications Are:  Outpatient Encounter Prescriptions as of 06/22/2016  Medication Sig  . simvastatin (ZOCOR) 40 MG tablet Take 1 tablet by mouth at bedtime.   . SUMAtriptan (IMITREX) 100 MG tablet Take 1 tablet by mouth as needed for migraine.   . triamcinolone cream (KENALOG) 0.5 % Apply 1 application topically every morning.    No facility-administered encounter medications on file as of 06/22/2016.   :  Review of Systems:  Out of a complete 14 point review of systems, all are reviewed and negative with the exception of these symptoms as listed below: Review of Systems  Neurological:       Patient is here for f/u. States he is doing well with CPAP, no new concerns. Needs new supplies.     Objective:  Neurologic Exam  Physical Exam Physical Examination:   Vitals:   06/22/16 0821  BP: 123/75  Pulse: (!) 52    General Examination: The patient is a very pleasant 60 y.o. male in no acute distress. He appears well-developed and well-nourished and very well groomed. He is in good spirits today.  HEENT: Normocephalic, atraumatic, pupils are equal, round and reactive to light and accommodation. He may have the beginning of mild cataracts bilaterally Extraocular tracking is good without limitation to gaze excursion or nystagmus noted. Normal smooth pursuit is noted. Hearing is grossly intact. Face is symmetric with normal facial animation and normal facial sensation. Speech is clear with no dysarthria noted. There is no hypophonia. There is no lip, neck/head, jaw or voice tremor. Neck is supple with full range of passive and active motion. There are no carotid bruits on  auscultation. Oropharynx exam reveals: mild mouth dryness, dentures in place. There is a moderately airway crowding, due to large tongue and floppy soft palate; uvula and tonsils are actually small.  Mallampati is class II. Tongue protrudes centrally and palate elevates symmetrically. Tonsils are 1+.    Chest: Clear to auscultation without wheezing, rhonchi or crackles noted.  Heart: S1+S2+0, regular and normal without murmurs, rubs or gallops noted.   Abdomen: Soft, non-tender and non-distended with normal bowel sounds appreciated on auscultation.  Extremities: There is no pitting edema in the distal lower extremities bilaterally. Pedal pulses are intact.  Skin: Warm and dry without trophic changes noted. There are no varicose veins.  Musculoskeletal: exam reveals no obvious joint deformities, tenderness or joint swelling or erythema.   Neurologically:  Mental status: The patient is awake, alert and oriented in all 4 spheres. His memory, attention, language and knowledge are appropriate. There is no aphasia, agnosia, apraxia or anomia. Speech is clear with normal prosody and enunciation. Thought process is linear. Mood is congruent and affect is normal.  Cranial nerves are as described above under HEENT exam. In addition, shoulder shrug is normal with equal shoulder height noted. Motor exam: Normal bulk, strength and tone is noted. There is no drift, tremor or rebound. Romberg is negative. Reflexes are 1-2+ throughout. Fine motor skills are intact with normal finger taps, normal hand movements, normal rapid alternating patting, normal foot taps and normal foot agility.  Cerebellar testing shows no dysmetria or intention tremor on finger to nose testing. There is no truncal or gait ataxia.  Sensory exam is intact to light touch in the upper and lower extremities.  Gait, station and balance are unremarkable. No veering to one side is noted. No leaning to one side is noted. Posture is  age-appropriate and stance is narrow based. No problems turning are noted. Tandem walk is unremarkable.        Assessment and Plan:   In summary, Vincent Daugherty is a very pleasant 60 year old male with an underlying medical history of allergic rhinitis, diverticulosis, arthritis with status post left total hip replacement surgery in December 2011, hyperlipidemia, migraines, erectile dysfunction, prostate cancer with status post prostatectomy, who presents for follow-up consultation of his obstructive sleep apnea. He had a sleep study in November 2015 which showed mild to moderate obstructive sleep apnea and I put him on AutoPap trial. He did well with that and since March 2015 he has been on a set pressure of 9 cm with compliance and good results. His exam is stable. He has had a decrease in his overall compliance compared to a year ago, but attributes this to not having received updated supplies, I placed an order for supplies for CPAP therapy. We went over his compliance data and his previous sleep study again today.  I encouraged him to continue using CPAP regularly to help reduce his cardiovascular risk long term and to ensure ongoing good results with his sleep. He had endorsed improvement in his nocturia as well. I would like to see him back on a yearly basis. I answered all his questions today and he was in agreement.  I spent 25 minutes in total face-to-face time with the patient, more than 50% of which was spent in counseling and coordination of care, reviewing test results, reviewing medication and discussing or reviewing the diagnosis of OSA, its prognosis and treatment options.

## 2016-07-03 DIAGNOSIS — G43009 Migraine without aura, not intractable, without status migrainosus: Secondary | ICD-10-CM | POA: Diagnosis not present

## 2016-07-03 DIAGNOSIS — G44219 Episodic tension-type headache, not intractable: Secondary | ICD-10-CM | POA: Diagnosis not present

## 2016-07-07 DIAGNOSIS — G4733 Obstructive sleep apnea (adult) (pediatric): Secondary | ICD-10-CM | POA: Diagnosis not present

## 2016-09-04 DIAGNOSIS — Z8546 Personal history of malignant neoplasm of prostate: Secondary | ICD-10-CM | POA: Diagnosis not present

## 2016-09-07 DIAGNOSIS — E784 Other hyperlipidemia: Secondary | ICD-10-CM | POA: Diagnosis not present

## 2016-09-07 DIAGNOSIS — Z0000 Encounter for general adult medical examination without abnormal findings: Secondary | ICD-10-CM | POA: Diagnosis not present

## 2016-09-07 DIAGNOSIS — Z125 Encounter for screening for malignant neoplasm of prostate: Secondary | ICD-10-CM | POA: Diagnosis not present

## 2016-09-11 DIAGNOSIS — N5201 Erectile dysfunction due to arterial insufficiency: Secondary | ICD-10-CM | POA: Diagnosis not present

## 2016-09-11 DIAGNOSIS — Z8546 Personal history of malignant neoplasm of prostate: Secondary | ICD-10-CM | POA: Diagnosis not present

## 2016-09-14 DIAGNOSIS — E784 Other hyperlipidemia: Secondary | ICD-10-CM | POA: Diagnosis not present

## 2016-09-14 DIAGNOSIS — Z0000 Encounter for general adult medical examination without abnormal findings: Secondary | ICD-10-CM | POA: Diagnosis not present

## 2016-09-14 DIAGNOSIS — G43909 Migraine, unspecified, not intractable, without status migrainosus: Secondary | ICD-10-CM | POA: Diagnosis not present

## 2016-09-14 DIAGNOSIS — Z1389 Encounter for screening for other disorder: Secondary | ICD-10-CM | POA: Diagnosis not present

## 2016-09-14 DIAGNOSIS — R0982 Postnasal drip: Secondary | ICD-10-CM | POA: Diagnosis not present

## 2016-09-14 DIAGNOSIS — G4733 Obstructive sleep apnea (adult) (pediatric): Secondary | ICD-10-CM | POA: Diagnosis not present

## 2016-10-05 DIAGNOSIS — Z1212 Encounter for screening for malignant neoplasm of rectum: Secondary | ICD-10-CM | POA: Diagnosis not present

## 2016-11-03 DIAGNOSIS — H0011 Chalazion right upper eyelid: Secondary | ICD-10-CM | POA: Diagnosis not present

## 2016-12-04 DIAGNOSIS — H0011 Chalazion right upper eyelid: Secondary | ICD-10-CM | POA: Diagnosis not present

## 2017-01-01 DIAGNOSIS — G44219 Episodic tension-type headache, not intractable: Secondary | ICD-10-CM | POA: Diagnosis not present

## 2017-01-01 DIAGNOSIS — G43009 Migraine without aura, not intractable, without status migrainosus: Secondary | ICD-10-CM | POA: Diagnosis not present

## 2017-03-10 DIAGNOSIS — L430 Hypertrophic lichen planus: Secondary | ICD-10-CM | POA: Diagnosis not present

## 2017-03-12 DIAGNOSIS — Z8546 Personal history of malignant neoplasm of prostate: Secondary | ICD-10-CM | POA: Diagnosis not present

## 2017-03-17 DIAGNOSIS — M25552 Pain in left hip: Secondary | ICD-10-CM | POA: Diagnosis not present

## 2017-03-17 DIAGNOSIS — Z96642 Presence of left artificial hip joint: Secondary | ICD-10-CM | POA: Diagnosis not present

## 2017-03-19 DIAGNOSIS — Z8546 Personal history of malignant neoplasm of prostate: Secondary | ICD-10-CM | POA: Diagnosis not present

## 2017-03-19 DIAGNOSIS — N5201 Erectile dysfunction due to arterial insufficiency: Secondary | ICD-10-CM | POA: Diagnosis not present

## 2017-06-02 DIAGNOSIS — H15102 Unspecified episcleritis, left eye: Secondary | ICD-10-CM | POA: Diagnosis not present

## 2017-06-09 DIAGNOSIS — H15102 Unspecified episcleritis, left eye: Secondary | ICD-10-CM | POA: Diagnosis not present

## 2017-06-21 ENCOUNTER — Ambulatory Visit: Payer: BLUE CROSS/BLUE SHIELD | Admitting: Neurology

## 2017-06-21 ENCOUNTER — Telehealth: Payer: Self-pay

## 2017-06-21 NOTE — Telephone Encounter (Signed)
Pt did not show for their appt with Dr. Athar today.  

## 2017-06-23 ENCOUNTER — Encounter: Payer: Self-pay | Admitting: Neurology

## 2017-08-25 DIAGNOSIS — Z8546 Personal history of malignant neoplasm of prostate: Secondary | ICD-10-CM | POA: Diagnosis not present

## 2017-09-01 DIAGNOSIS — N5201 Erectile dysfunction due to arterial insufficiency: Secondary | ICD-10-CM | POA: Diagnosis not present

## 2017-09-01 DIAGNOSIS — Z8546 Personal history of malignant neoplasm of prostate: Secondary | ICD-10-CM | POA: Diagnosis not present

## 2017-09-17 DIAGNOSIS — E7849 Other hyperlipidemia: Secondary | ICD-10-CM | POA: Diagnosis not present

## 2017-09-24 DIAGNOSIS — Z0000 Encounter for general adult medical examination without abnormal findings: Secondary | ICD-10-CM | POA: Diagnosis not present

## 2017-09-24 DIAGNOSIS — E7849 Other hyperlipidemia: Secondary | ICD-10-CM | POA: Diagnosis not present

## 2017-09-24 DIAGNOSIS — G4733 Obstructive sleep apnea (adult) (pediatric): Secondary | ICD-10-CM | POA: Diagnosis not present

## 2017-09-24 DIAGNOSIS — G43909 Migraine, unspecified, not intractable, without status migrainosus: Secondary | ICD-10-CM | POA: Diagnosis not present

## 2017-09-24 DIAGNOSIS — R51 Headache: Secondary | ICD-10-CM | POA: Diagnosis not present

## 2017-09-24 DIAGNOSIS — M25571 Pain in right ankle and joints of right foot: Secondary | ICD-10-CM | POA: Diagnosis not present

## 2017-11-29 DIAGNOSIS — Z1212 Encounter for screening for malignant neoplasm of rectum: Secondary | ICD-10-CM | POA: Diagnosis not present

## 2017-11-30 DIAGNOSIS — R1032 Left lower quadrant pain: Secondary | ICD-10-CM | POA: Diagnosis not present

## 2017-12-01 DIAGNOSIS — K5792 Diverticulitis of intestine, part unspecified, without perforation or abscess without bleeding: Secondary | ICD-10-CM | POA: Diagnosis not present

## 2018-03-09 DIAGNOSIS — Z8546 Personal history of malignant neoplasm of prostate: Secondary | ICD-10-CM | POA: Diagnosis not present

## 2018-03-16 DIAGNOSIS — N5201 Erectile dysfunction due to arterial insufficiency: Secondary | ICD-10-CM | POA: Diagnosis not present

## 2018-03-16 DIAGNOSIS — Z8546 Personal history of malignant neoplasm of prostate: Secondary | ICD-10-CM | POA: Diagnosis not present

## 2018-04-19 DIAGNOSIS — G43909 Migraine, unspecified, not intractable, without status migrainosus: Secondary | ICD-10-CM | POA: Diagnosis not present

## 2018-04-19 DIAGNOSIS — J209 Acute bronchitis, unspecified: Secondary | ICD-10-CM | POA: Diagnosis not present

## 2018-07-08 DIAGNOSIS — N5201 Erectile dysfunction due to arterial insufficiency: Secondary | ICD-10-CM | POA: Diagnosis not present

## 2018-09-27 DIAGNOSIS — E7849 Other hyperlipidemia: Secondary | ICD-10-CM | POA: Diagnosis not present

## 2018-09-27 DIAGNOSIS — R82998 Other abnormal findings in urine: Secondary | ICD-10-CM | POA: Diagnosis not present

## 2018-09-27 DIAGNOSIS — Z0000 Encounter for general adult medical examination without abnormal findings: Secondary | ICD-10-CM | POA: Diagnosis not present

## 2018-10-04 DIAGNOSIS — Z0000 Encounter for general adult medical examination without abnormal findings: Secondary | ICD-10-CM | POA: Diagnosis not present

## 2018-10-04 DIAGNOSIS — G4733 Obstructive sleep apnea (adult) (pediatric): Secondary | ICD-10-CM | POA: Diagnosis not present

## 2018-10-04 DIAGNOSIS — E7849 Other hyperlipidemia: Secondary | ICD-10-CM | POA: Diagnosis not present

## 2018-10-04 DIAGNOSIS — G43909 Migraine, unspecified, not intractable, without status migrainosus: Secondary | ICD-10-CM | POA: Diagnosis not present

## 2018-10-05 DIAGNOSIS — Z1212 Encounter for screening for malignant neoplasm of rectum: Secondary | ICD-10-CM | POA: Diagnosis not present

## 2018-11-25 DIAGNOSIS — K5730 Diverticulosis of large intestine without perforation or abscess without bleeding: Secondary | ICD-10-CM | POA: Diagnosis not present

## 2018-11-25 DIAGNOSIS — K635 Polyp of colon: Secondary | ICD-10-CM | POA: Diagnosis not present

## 2018-11-25 DIAGNOSIS — D122 Benign neoplasm of ascending colon: Secondary | ICD-10-CM | POA: Diagnosis not present

## 2018-11-25 DIAGNOSIS — D120 Benign neoplasm of cecum: Secondary | ICD-10-CM | POA: Diagnosis not present

## 2018-11-25 DIAGNOSIS — K644 Residual hemorrhoidal skin tags: Secondary | ICD-10-CM | POA: Diagnosis not present

## 2018-11-25 DIAGNOSIS — D123 Benign neoplasm of transverse colon: Secondary | ICD-10-CM | POA: Diagnosis not present

## 2018-11-25 DIAGNOSIS — K648 Other hemorrhoids: Secondary | ICD-10-CM | POA: Diagnosis not present

## 2018-11-25 DIAGNOSIS — Z86010 Personal history of colonic polyps: Secondary | ICD-10-CM | POA: Diagnosis not present

## 2018-11-25 DIAGNOSIS — D124 Benign neoplasm of descending colon: Secondary | ICD-10-CM | POA: Diagnosis not present

## 2018-11-29 DIAGNOSIS — D124 Benign neoplasm of descending colon: Secondary | ICD-10-CM | POA: Diagnosis not present

## 2018-11-29 DIAGNOSIS — D122 Benign neoplasm of ascending colon: Secondary | ICD-10-CM | POA: Diagnosis not present

## 2018-11-29 DIAGNOSIS — D123 Benign neoplasm of transverse colon: Secondary | ICD-10-CM | POA: Diagnosis not present

## 2018-11-29 DIAGNOSIS — D120 Benign neoplasm of cecum: Secondary | ICD-10-CM | POA: Diagnosis not present

## 2019-03-02 DIAGNOSIS — Z8546 Personal history of malignant neoplasm of prostate: Secondary | ICD-10-CM | POA: Diagnosis not present

## 2019-03-07 DIAGNOSIS — Z8546 Personal history of malignant neoplasm of prostate: Secondary | ICD-10-CM | POA: Diagnosis not present

## 2019-03-07 DIAGNOSIS — N5201 Erectile dysfunction due to arterial insufficiency: Secondary | ICD-10-CM | POA: Diagnosis not present

## 2019-03-21 ENCOUNTER — Telehealth: Payer: Self-pay | Admitting: Neurology

## 2019-03-21 NOTE — Telephone Encounter (Signed)
I attempted to contacted the pt. Pt was not avalible but I was able to leave a vm on his Home # requesting a call back.   Upon review of the chart, pt's last appt was in 2017, he is due for an appt to address his concerns related to cpap therapy.

## 2019-03-21 NOTE — Telephone Encounter (Signed)
Pt states his Cpap is not blowing right and would like to know how he can get it adjusted. Please advise.

## 2019-03-23 NOTE — Telephone Encounter (Signed)
I called pt, scheduled him an in office appt on 03/28/19 at 2:00pm. Pt will bring his cpap. Pt understands our COVID policies and procedures.

## 2019-03-28 ENCOUNTER — Ambulatory Visit: Payer: Self-pay | Admitting: Neurology

## 2019-03-28 NOTE — Telephone Encounter (Signed)
Pt did not show for their appt with Dr. Athar today.  

## 2019-03-29 ENCOUNTER — Encounter: Payer: Self-pay | Admitting: Adult Health

## 2019-03-30 ENCOUNTER — Ambulatory Visit (INDEPENDENT_AMBULATORY_CARE_PROVIDER_SITE_OTHER): Payer: BC Managed Care – PPO | Admitting: Neurology

## 2019-03-30 ENCOUNTER — Other Ambulatory Visit: Payer: Self-pay

## 2019-03-30 ENCOUNTER — Encounter: Payer: Self-pay | Admitting: Neurology

## 2019-03-30 VITALS — BP 126/77 | HR 61 | Temp 98.4°F | Ht 74.0 in | Wt 225.0 lb

## 2019-03-30 DIAGNOSIS — G4733 Obstructive sleep apnea (adult) (pediatric): Secondary | ICD-10-CM | POA: Diagnosis not present

## 2019-03-30 DIAGNOSIS — Z9989 Dependence on other enabling machines and devices: Secondary | ICD-10-CM | POA: Diagnosis not present

## 2019-03-30 NOTE — Patient Instructions (Signed)
Keep up the good work! You have done a great job using your CPAP machine, the settings look good from my end of things.   I will write for supplies, I think you are eligible for a new machine, please get in touch with your DME company, [employer] next week about it.   I will order your new machine. You will need a follow-up within 3 months after starting the new machine to comply with insurance requirements. We can see you once a year after that hopefully.

## 2019-03-30 NOTE — Progress Notes (Signed)
Order for new cpap and supplies sent to [facility] via community message. Confirmation received that the order transmitted was successful.

## 2019-03-30 NOTE — Progress Notes (Signed)
Subjective:    Patient ID: Vincent Daugherty is a 63 y.o. male.  HPI     Interim history:   Vincent Daugherty is a very pleasant 63 year old right-handed gentleman with an underlying medical history of allergic rhinitis, diverticulosis, arthritis, status post left total hip replacement surgery in December 2011, hyperlipidemia, migraines, erectile dysfunction, and prior diagnosis of obstructive sleep apnea, who presents for followup consultation of his obstructive sleep apnea, on treatment with CPAP therapy.  He is unaccompanied today and presents after a long gap of over 2  years.  He no showed for an appointment on 06/21/2017 and recently missed an appointment on 03/28/19. I last saw him on 06/22/2016, at which time he was suboptimal with his PAP compliance. He had a lapse in his treatment because of lacking supplies.   Today, 03/28/2019: 06/21/2017: I reviewed his CPAP compliance data from 02/28/2019 through 03/29/2019 which is a total of 30 days, during which time he used his machine every night with percent used days greater than 4 hours at 80%, indicating very good compliance with an average usage of 5 hours and 49 minutes, residual AHI at goal at 1.3/h, leak acceptable with a 95th percentile at 10.1 L/min on a pressure of 9 cm with EPR of 2  He reports that some weeks ago, it felt to him that the machine was not blowing correctly.  This seems to be better now.  He needs new supplies. His set up date was 11/06/2013.  He has a S9 from [employer] and may be eligible for a new CPAP machine. He has had no recent acute illnesses, feels stable.  He benefits from CPAP therapy, in fact, he feels that he cannot sleep without it.  He uses nasal pillows.   Previously:    I saw him on 06/20/2015, at which time he was doing well and fully compliant with CPAP. He was unfortunately diagnosed with prostate cancer in the interim and had a prostatectomy on 02/04/2015, with a subsequent infection which was treated with  antibiotics. He had thankfully no residual problems and needed no chemotherapy or radiation.    I reviewed his CPAP compliance data from 05/23/16 through 06/21/16, which is a total of 30 days, during which time he used his machine 27 days, with percent used days greater than 4 hours at 50%, indicating suboptimal compliance with an average usage of 3 hours and 47 minutes, AHI 1.4 per hour, leak acceptable with the 95th percentile at 18 L/m on a pressure of 9 cm with EPR of 2.   I saw him on 06/19/2014, at which time he reported that his nocturia had improved. Sometimes he was falling asleep before putting the mask on. He had some sleep maintenance issues. Overall however, he felt that he was doing rather well. His sleep was better and his snoring has improved.    I reviewed his CPAP compliance data from 05/19/2015 through 06/17/2015 which is a total of 30 days during which time he used his machine every night with percent used days greater than 4 hours at 97%, indicating excellent compliance with an average usage of 6 hours and 9 minutes, residual AHI acceptable at 3 per hour, leak low with the 95th percentile at 18 L/m on a pressure of 9 cm with EPR of 2.   I saw him on 01/22/2014, at which time he reported feeling a little better overall. He reported that his wife noted no more snoring, his nocturia was improved and he reported less  morning headaches. He felt less tired during the day.he felt his sleep was more consolidated. Overall, he felt his CPAP experience was much better than in the past. Based on his compliance data I suggested we change him from AutoPap to set pressure of 9 cm at the time of his last visit.   I reviewed the patient's CPAP compliance data from 01/06/2014 to 02/04/2014, which is a total of 30 days, during which time the patient used CPAP every day. The average usage for all days was 6 hours and 19 minutes. The percent used days greater than 4 hours was 97 %, indicating excellent  compliance. The residual AHI was 2.3 per hour, indicating an adequate treatment pressure of 9 cwp with EPR of 2. Air leak from the mask was low at 9.1 L per minute at the 95th percentile.    I reviewed his compliance data from 05/21/2014 through 06/18/2014 which is a total of 29 days during which time he used his machine every night. Percent used days greater than 4 hours was above 85%, residual AHI low at 2.7, leak low at 11.2, pressure at 9 with EPR of 2, average usage of over 5 hours and 20 minutes.   I first met him on 08/31/2013, at which time he reported occasional morning headaches, and nighttime sleep disruption. I asked him to return for sleep study. He had a baseline sleep study on 09/09/2013 and I went over his test results with him in detail today. His sleep efficiency was reduced at 81%. Latency to sleep was 4 minutes and wake after sleep onset was 78 minutes with severe sleep fragmentation noted. He had an elevated arousal index of 40.7 per hour. His sleep architecture showed an increased percentage of stage I and stage II sleep, a normal percentage of deep sleep and a decreased percentage of REM sleep at 11.4% with a mildly prolonged REM latency. He had no significant periodic leg movements of sleep and no significant EKG changes. Mild to moderate snoring was noted. He had a total AHI of 11.7 per hour, rising to 37.5 per hour in REM sleep and 14.6 per hour in the supine position. Baseline oxygen saturation was 95%, nadir was 86%. Based on the test results I suggested he try AutoPap nonprescribed and AutoPap machine for him. I reviewed compliance data from 12/06/2013 through 01/04/2014 which is a total of 30 days during which time he uses CPAP every night except for one night. Percent used days greater than 4 hours was 87%, indicating very good compliance. Average usage was 6 hours and 2 minutes. Residual AHI was 2.3 per hour. Leak was low. 95th percentile pressure was 9.1 cm. Maximum pressure was  10.3 cm, median pressure was 7.5 cm, range from 6-16 cm with EPR of 2.   I reviewed his compliance data from 12/22/2013 to 01/20/2014 which is 30 days, during which time he used CPAP every night. Percent used days greater than 4 hours was 93%, indicating excellent compliance. Average usage was 5 hours and 53 minutes, AHI was 2.4 and leak was low at 7.9 L per minute at the 95th percentile. He was on AutoPap and 95th percentile pressure was 8.8 cm.   He had previously been treated with CPAP, but stopped using it. I reviewed his sleep study report from 10/23/2006 last time: His total AHI was 4.9 per hour rising to 6.1 per hour. He had an increased percentage of stage I and stage II sleep, a normal REM latency and mildly  decrease of REM percentage, and absence of deep sleep. According to the sleep study report he had been diagnosed with severe obstructive sleep apnea before, based on an AHI of 30 per hour in June 2005 as well as a CPAP titration study in July 2005 during which time his pressure was 7 cm of water pressure. He used it for about 1-2 months, but did not like the noise of the machine and the mask was uncomfortable.   His Past Medical History Is Significant For: Past Medical History:  Diagnosis Date  . Complication of anesthesia    nausea during recovery  . Impotence of organic origin   . Migraine, unspecified, without mention of intractable migraine without mention of status migrainosus   . Need for prophylactic vaccination and inoculation against influenza   . Nocturia   . Obstructive sleep apnea (adult) (pediatric)   . OSA (obstructive sleep apnea) 08/31/2013  . Other and unspecified hyperlipidemia   . Personal history of other diseases of digestive system   . Prostate cancer [facility])     His Past Surgical History Is Significant For: Past Surgical History:  Procedure Laterality Date  . APPENDECTOMY    . PROSTATE BIOPSY    . ROBOT ASSISTED LAPAROSCOPIC RADICAL PROSTATECTOMY N/A  02/04/2015   Procedure: ROBOTIC ASSISTED LAPAROSCOPIC RADICAL PROSTATECTOMY LEVEL 1;  Surgeon: Raynelle Bring, MD;  Location: [facility];  Service: Urology;  Laterality: N/A;  . SP THROMBOECT PRIM MECH ADD - SEP     anterior approach  . TOTAL HIP ARTHROPLASTY     anterior approach, left hip    His Family History Is Significant For: Family History  Problem Relation Age of Onset  . Ulcers Father   . Hypertension Mother   . CAD Mother   . Stroke Mother     His Social History Is Significant For: Social History   Socioeconomic History  . Marital status: Married    Spouse name: phillis  . Number of children: 3  . Years of education: college  . Highest education level: Not on file  Occupational History  . Occupation: Marketing executive: [employer]  . Financial resource strain: Not on file  . Food insecurity:    Worry: Not on file    Inability: Not on file  . Transportation needs:    Medical: Not on file    Non-medical: Not on file  Tobacco Use  . Smoking status: Former Smoker    Packs/day: 0.50    Years: 1.00    Pack years: 0.50    Types: Cigarettes    Last attempt to quit: 10/26/1981    Years since quitting: 37.4  . Smokeless tobacco: Never Used  Substance and Sexual Activity  . Alcohol use: No    Alcohol/week: 0.5 standard drinks    Types: 1 Standard drinks or equivalent per week  . Drug use: No  . Sexual activity: Yes  Lifestyle  . Physical activity:    Days per week: Not on file    Minutes per session: Not on file  . Stress: Not on file  Relationships  . Social connections:    Talks on phone: Not on file    Gets together: Not on file    Attends religious service: Not on file    Active member of club or organization: Not on file    Attends meetings of clubs or organizations: Not on file    Relationship status: Not on file  Other Topics  Concern  . Not on file  Social History Narrative  . Not on file    His Allergies Are:  Allergies   Allergen Reactions  . Ampicillin Diarrhea  :   His Current Medications Are:  Outpatient Encounter Medications as of 03/30/2019  Medication Sig  . simvastatin (ZOCOR) 40 MG tablet Take 1 tablet by mouth at bedtime.   . SUMAtriptan (IMITREX) 100 MG tablet Take 1 tablet by mouth as needed for migraine.   . triamcinolone cream (KENALOG) 0.5 % Apply 1 application topically every morning.    No facility-administered encounter medications on file as of 03/30/2019.   :  Review of Systems:  Out of a complete 14 point review of systems, all are reviewed and negative with the exception of these symptoms as listed below: Review of Systems  Neurological:       Pt presents today to discuss his cpap. He feels that the pressure is not right.    Objective:  Neurological Exam  Physical Exam Physical Examination:   Vitals:   03/30/19 1507  BP: 126/77  Pulse: 61  Temp: 98.4 F (36.9 C)    General Examination: The patient is a very pleasant 63 y.o. male in no acute distress. He appears well-developed and well-nourished and well groomed.   HEENT: Normocephalic, atraumatic, pupils are equal, round and reactive to light and accommodation. He has Corrective eyeglasses in place. Extraocular tracking is good without limitation to gaze excursion or nystagmus noted. Normal smooth pursuit is noted. Hearing is grossly intact. Face is symmetric with normal facial animation and normal facial sensation. Speech is clear with no dysarthria noted. There is no hypophonia. There is no lip, neck/head, jaw or voice tremor. No carotid bruits on auscultation. Oropharynx exam reveals: mild mouth dryness, dentures in place. There is a moderately airway crowding. Tongue protrudes centrally and palate elevates symmetrically.   Chest: Clear to auscultation without wheezing, rhonchi or crackles noted.  Heart: S1+S2+0, regular and normal without murmurs, rubs or gallops noted.   Abdomen: Soft, non-tender and  non-distended.  Extremities: There is no obvious edema in the distal lower extremities bilaterally.   Skin: Warm and dry without trophic changes noted.   Musculoskeletal: exam reveals no obvious joint deformities, tenderness or joint swelling or erythema.   Neurologically:  Mental status: The patient is awake, alert and oriented in all 4 spheres. His memory, attention, language and knowledge are appropriate. There is no aphasia, agnosia, apraxia or anomia. Speech is clear with normal prosody and enunciation. Thought process is linear. Mood is congruent and affect is normal.  Cranial nerves are as described above under HEENT exam. In addition, shoulder shrug is normal with equal shoulder height noted. Motor exam: Normal bulk, strength and tone is noted. There is no drift, tremor or rebound. Romberg is negative. Fine motor skills are grossly intact.  Cerebellar testing shows no dysmetria or intention tremor on finger to nose testing. There is no truncal or gait ataxia.  Sensory exam is intact to light touch in the upper and lower extremities.  Gait, station and balance are unremarkable. No veering to one side is noted. No leaning to one side is noted. Posture is age-appropriate and stance is narrow based. No problems turning are noted. Tandem walk is unremarkable.        Assessment and Plan:   In summary, Vincent Daugherty is a very pleasant 63 year old male with an underlying medical history of allergic rhinitis, diverticulosis, arthritis with status post left total  hip replacement surgery in December 2011, hyperlipidemia, migraines, erectile dysfunction, prostate cancer with status post prostatectomy, who presents for follow-up consultation of his obstructive sleep apnea, after a long gap. He had a sleep study in November 2014, which showed mild to moderate obstructive sleep apnea and he was initially on AutoPAP. He has been on CPAP of 9 cm since March 2015 he has been on a set pressure of 9 cm  with compliance and good results. His exam has stable. He has Lost a little weight with time, Currently about 9 pounds less than when we tested his sleep.  He just sleeps well with CPAP therapy.  He is very likely eligible for a new machine, I will also renew his supplies. We went over his compliance data and his previous sleep study again today. When he starts his new CPAP machine, he will need a follow-up in about 3 months, after that he can be seen on a yearly basis. I answered all his questions today and he was in agreement.  I spent 25 minutes in total face-to-face time with the patient, more than 50% of which was spent in counseling and coordination of care, reviewing test results, reviewing medication and discussing or reviewing the diagnosis of OSA, its prognosis and treatment options. Pertinent laboratory and imaging test results that were available during this visit with the patient were reviewed by me and considered in my medical decision making (see chart for details).

## 2019-04-07 DIAGNOSIS — R42 Dizziness and giddiness: Secondary | ICD-10-CM | POA: Diagnosis not present

## 2019-04-07 DIAGNOSIS — G43909 Migraine, unspecified, not intractable, without status migrainosus: Secondary | ICD-10-CM | POA: Diagnosis not present

## 2019-04-11 DIAGNOSIS — L280 Lichen simplex chronicus: Secondary | ICD-10-CM | POA: Diagnosis not present

## 2019-04-13 DIAGNOSIS — M545 Low back pain: Secondary | ICD-10-CM | POA: Diagnosis not present

## 2019-04-21 DIAGNOSIS — M5416 Radiculopathy, lumbar region: Secondary | ICD-10-CM | POA: Diagnosis not present

## 2019-06-02 DIAGNOSIS — G4733 Obstructive sleep apnea (adult) (pediatric): Secondary | ICD-10-CM | POA: Diagnosis not present

## 2019-07-03 DIAGNOSIS — G4733 Obstructive sleep apnea (adult) (pediatric): Secondary | ICD-10-CM | POA: Diagnosis not present

## 2019-07-26 ENCOUNTER — Telehealth: Payer: Self-pay

## 2019-07-26 ENCOUNTER — Ambulatory Visit: Payer: BC Managed Care – PPO | Admitting: Adult Health

## 2019-07-26 ENCOUNTER — Encounter: Payer: Self-pay | Admitting: Adult Health

## 2019-07-26 NOTE — Telephone Encounter (Signed)
Patient was a no call/no show for their appointment today.   

## 2019-08-02 DIAGNOSIS — G4733 Obstructive sleep apnea (adult) (pediatric): Secondary | ICD-10-CM | POA: Diagnosis not present

## 2019-09-02 DIAGNOSIS — G4733 Obstructive sleep apnea (adult) (pediatric): Secondary | ICD-10-CM | POA: Diagnosis not present

## 2019-10-02 DIAGNOSIS — G4733 Obstructive sleep apnea (adult) (pediatric): Secondary | ICD-10-CM | POA: Diagnosis not present

## 2019-10-04 DIAGNOSIS — E7849 Other hyperlipidemia: Secondary | ICD-10-CM | POA: Diagnosis not present

## 2019-10-04 DIAGNOSIS — Z125 Encounter for screening for malignant neoplasm of prostate: Secondary | ICD-10-CM | POA: Diagnosis not present

## 2019-10-12 DIAGNOSIS — G4733 Obstructive sleep apnea (adult) (pediatric): Secondary | ICD-10-CM | POA: Diagnosis not present

## 2019-10-12 DIAGNOSIS — Z1331 Encounter for screening for depression: Secondary | ICD-10-CM | POA: Diagnosis not present

## 2019-10-12 DIAGNOSIS — Z0000 Encounter for general adult medical examination without abnormal findings: Secondary | ICD-10-CM | POA: Diagnosis not present

## 2019-10-12 DIAGNOSIS — Z8546 Personal history of malignant neoplasm of prostate: Secondary | ICD-10-CM | POA: Diagnosis not present

## 2019-10-12 DIAGNOSIS — G43909 Migraine, unspecified, not intractable, without status migrainosus: Secondary | ICD-10-CM | POA: Diagnosis not present

## 2019-10-12 DIAGNOSIS — E785 Hyperlipidemia, unspecified: Secondary | ICD-10-CM | POA: Diagnosis not present

## 2019-11-02 DIAGNOSIS — G4733 Obstructive sleep apnea (adult) (pediatric): Secondary | ICD-10-CM | POA: Diagnosis not present

## 2019-12-03 DIAGNOSIS — G4733 Obstructive sleep apnea (adult) (pediatric): Secondary | ICD-10-CM | POA: Diagnosis not present

## 2019-12-31 DIAGNOSIS — G4733 Obstructive sleep apnea (adult) (pediatric): Secondary | ICD-10-CM | POA: Diagnosis not present

## 2020-02-16 DIAGNOSIS — H2513 Age-related nuclear cataract, bilateral: Secondary | ICD-10-CM | POA: Diagnosis not present

## 2020-02-16 DIAGNOSIS — H5213 Myopia, bilateral: Secondary | ICD-10-CM | POA: Diagnosis not present

## 2020-02-21 DIAGNOSIS — Z8546 Personal history of malignant neoplasm of prostate: Secondary | ICD-10-CM | POA: Diagnosis not present

## 2020-02-28 DIAGNOSIS — N5201 Erectile dysfunction due to arterial insufficiency: Secondary | ICD-10-CM | POA: Diagnosis not present

## 2020-02-28 DIAGNOSIS — N3941 Urge incontinence: Secondary | ICD-10-CM | POA: Diagnosis not present

## 2020-02-28 DIAGNOSIS — Z8546 Personal history of malignant neoplasm of prostate: Secondary | ICD-10-CM | POA: Diagnosis not present

## 2020-02-28 DIAGNOSIS — R3121 Asymptomatic microscopic hematuria: Secondary | ICD-10-CM | POA: Diagnosis not present

## 2020-04-05 ENCOUNTER — Other Ambulatory Visit (HOSPITAL_COMMUNITY): Payer: Self-pay | Admitting: Internal Medicine

## 2020-04-05 DIAGNOSIS — I714 Abdominal aortic aneurysm, without rupture, unspecified: Secondary | ICD-10-CM

## 2020-04-08 ENCOUNTER — Ambulatory Visit (HOSPITAL_COMMUNITY)
Admission: RE | Admit: 2020-04-08 | Discharge: 2020-04-08 | Disposition: A | Discharge status: Home / Self Care | Payer: BC Managed Care – PPO | Source: Ambulatory Visit | Attending: Surgery | Admitting: Surgery

## 2020-04-08 ENCOUNTER — Other Ambulatory Visit: Payer: Self-pay

## 2020-04-08 DIAGNOSIS — I714 Abdominal aortic aneurysm, without rupture, unspecified: Secondary | ICD-10-CM

## 2020-08-02 DIAGNOSIS — H2513 Age-related nuclear cataract, bilateral: Secondary | ICD-10-CM | POA: Diagnosis not present

## 2020-08-02 DIAGNOSIS — H10413 Chronic giant papillary conjunctivitis, bilateral: Secondary | ICD-10-CM | POA: Diagnosis not present

## 2020-08-02 DIAGNOSIS — H5213 Myopia, bilateral: Secondary | ICD-10-CM | POA: Diagnosis not present

## 2020-10-16 DIAGNOSIS — E785 Hyperlipidemia, unspecified: Secondary | ICD-10-CM | POA: Diagnosis not present

## 2020-10-16 DIAGNOSIS — Z125 Encounter for screening for malignant neoplasm of prostate: Secondary | ICD-10-CM | POA: Diagnosis not present

## 2020-10-29 DIAGNOSIS — Z1339 Encounter for screening examination for other mental health and behavioral disorders: Secondary | ICD-10-CM | POA: Diagnosis not present

## 2020-10-29 DIAGNOSIS — Z1331 Encounter for screening for depression: Secondary | ICD-10-CM | POA: Diagnosis not present

## 2020-10-29 DIAGNOSIS — R82998 Other abnormal findings in urine: Secondary | ICD-10-CM | POA: Diagnosis not present

## 2020-10-29 DIAGNOSIS — E785 Hyperlipidemia, unspecified: Secondary | ICD-10-CM | POA: Diagnosis not present

## 2020-10-29 DIAGNOSIS — Z0000 Encounter for general adult medical examination without abnormal findings: Secondary | ICD-10-CM | POA: Diagnosis not present

## 2021-02-24 DIAGNOSIS — Z8546 Personal history of malignant neoplasm of prostate: Secondary | ICD-10-CM | POA: Diagnosis not present

## 2021-02-26 DIAGNOSIS — N5201 Erectile dysfunction due to arterial insufficiency: Secondary | ICD-10-CM | POA: Diagnosis not present

## 2021-02-26 DIAGNOSIS — Z8546 Personal history of malignant neoplasm of prostate: Secondary | ICD-10-CM | POA: Diagnosis not present

## 2021-05-01 DIAGNOSIS — M19012 Primary osteoarthritis, left shoulder: Secondary | ICD-10-CM | POA: Diagnosis not present

## 2021-05-12 DIAGNOSIS — R051 Acute cough: Secondary | ICD-10-CM | POA: Diagnosis not present

## 2021-05-12 DIAGNOSIS — U071 COVID-19: Secondary | ICD-10-CM | POA: Diagnosis not present

## 2021-06-11 DIAGNOSIS — M79672 Pain in left foot: Secondary | ICD-10-CM | POA: Diagnosis not present

## 2021-06-24 DIAGNOSIS — M722 Plantar fascial fibromatosis: Secondary | ICD-10-CM | POA: Diagnosis not present

## 2021-06-24 DIAGNOSIS — M79672 Pain in left foot: Secondary | ICD-10-CM | POA: Diagnosis not present

## 2021-06-24 DIAGNOSIS — M898X7 Other specified disorders of bone, ankle and foot: Secondary | ICD-10-CM | POA: Diagnosis not present

## 2021-08-08 DIAGNOSIS — H5213 Myopia, bilateral: Secondary | ICD-10-CM | POA: Diagnosis not present

## 2021-08-08 DIAGNOSIS — H2513 Age-related nuclear cataract, bilateral: Secondary | ICD-10-CM | POA: Diagnosis not present

## 2021-11-24 DIAGNOSIS — E785 Hyperlipidemia, unspecified: Secondary | ICD-10-CM | POA: Diagnosis not present

## 2021-11-24 DIAGNOSIS — Z125 Encounter for screening for malignant neoplasm of prostate: Secondary | ICD-10-CM | POA: Diagnosis not present

## 2021-12-01 DIAGNOSIS — Z1339 Encounter for screening examination for other mental health and behavioral disorders: Secondary | ICD-10-CM | POA: Diagnosis not present

## 2021-12-01 DIAGNOSIS — Z0000 Encounter for general adult medical examination without abnormal findings: Secondary | ICD-10-CM | POA: Diagnosis not present

## 2021-12-01 DIAGNOSIS — G43909 Migraine, unspecified, not intractable, without status migrainosus: Secondary | ICD-10-CM | POA: Diagnosis not present

## 2021-12-01 DIAGNOSIS — Z1331 Encounter for screening for depression: Secondary | ICD-10-CM | POA: Diagnosis not present

## 2021-12-01 DIAGNOSIS — Z23 Encounter for immunization: Secondary | ICD-10-CM | POA: Diagnosis not present

## 2022-02-24 DIAGNOSIS — Z8546 Personal history of malignant neoplasm of prostate: Secondary | ICD-10-CM | POA: Diagnosis not present

## 2022-02-24 DIAGNOSIS — N5201 Erectile dysfunction due to arterial insufficiency: Secondary | ICD-10-CM | POA: Diagnosis not present

## 2022-04-17 DIAGNOSIS — K648 Other hemorrhoids: Secondary | ICD-10-CM | POA: Diagnosis not present

## 2022-04-17 DIAGNOSIS — K5730 Diverticulosis of large intestine without perforation or abscess without bleeding: Secondary | ICD-10-CM | POA: Diagnosis not present

## 2022-04-17 DIAGNOSIS — K644 Residual hemorrhoidal skin tags: Secondary | ICD-10-CM | POA: Diagnosis not present

## 2022-04-17 DIAGNOSIS — Z86010 Personal history of colonic polyps: Secondary | ICD-10-CM | POA: Diagnosis not present

## 2022-04-17 DIAGNOSIS — Z09 Encounter for follow-up examination after completed treatment for conditions other than malignant neoplasm: Secondary | ICD-10-CM | POA: Diagnosis not present

## 2022-04-17 DIAGNOSIS — D128 Benign neoplasm of rectum: Secondary | ICD-10-CM | POA: Diagnosis not present

## 2022-08-14 DIAGNOSIS — H5213 Myopia, bilateral: Secondary | ICD-10-CM | POA: Diagnosis not present

## 2022-08-14 DIAGNOSIS — H2513 Age-related nuclear cataract, bilateral: Secondary | ICD-10-CM | POA: Diagnosis not present

## 2024-01-03 ENCOUNTER — Other Ambulatory Visit: Payer: Self-pay | Admitting: Internal Medicine

## 2024-01-03 DIAGNOSIS — M5416 Radiculopathy, lumbar region: Secondary | ICD-10-CM

## 2024-01-12 ENCOUNTER — Encounter: Payer: Self-pay | Admitting: Internal Medicine

## 2024-01-15 ENCOUNTER — Ambulatory Visit
Admission: RE | Admit: 2024-01-15 | Discharge: 2024-01-15 | Disposition: A | Discharge status: Home / Self Care | Source: Ambulatory Visit | Attending: Internal Medicine | Admitting: Internal Medicine

## 2024-01-15 DIAGNOSIS — M5416 Radiculopathy, lumbar region: Secondary | ICD-10-CM

## 2024-03-25 ENCOUNTER — Other Ambulatory Visit (HOSPITAL_BASED_OUTPATIENT_CLINIC_OR_DEPARTMENT_OTHER): Payer: Self-pay

## 2024-03-25 MED ORDER — HYDROMORPHONE HCL 2 MG PO TABS
2.0000 mg | ORAL_TABLET | ORAL | 0 refills | Status: DC | PRN
  Filled 2024-03-25: qty 30, 5d supply, fill #0

## 2024-04-12 ENCOUNTER — Other Ambulatory Visit (HOSPITAL_BASED_OUTPATIENT_CLINIC_OR_DEPARTMENT_OTHER): Payer: Self-pay

## 2024-04-12 MED ORDER — HYDROMORPHONE HCL 2 MG PO TABS
2.0000 mg | ORAL_TABLET | ORAL | 0 refills | Status: AC | PRN
  Filled 2024-04-12: qty 30, 5d supply, fill #0

## 2024-04-13 ENCOUNTER — Other Ambulatory Visit: Payer: Self-pay

## 2024-05-02 ENCOUNTER — Other Ambulatory Visit (HOSPITAL_BASED_OUTPATIENT_CLINIC_OR_DEPARTMENT_OTHER): Payer: Self-pay

## 2024-05-02 MED ORDER — TIZANIDINE HCL 2 MG PO TABS
2.0000 mg | ORAL_TABLET | Freq: Every evening | ORAL | 1 refills | Status: AC | PRN
  Filled 2024-05-02: qty 30, 15d supply, fill #0

## 2024-05-12 ENCOUNTER — Other Ambulatory Visit (HOSPITAL_BASED_OUTPATIENT_CLINIC_OR_DEPARTMENT_OTHER): Payer: Self-pay

## 2024-06-17 DIAGNOSIS — Z0100 Encounter for examination of eyes and vision without abnormal findings: Secondary | ICD-10-CM | POA: Diagnosis not present
